# Patient Record
Sex: Female | Born: 1935 | Race: Black or African American | Hispanic: No | Marital: Single | Smoking: Never smoker
Health system: Southern US, Community
[De-identification: ages and names within clinical notes are randomized; demographics above are authoritative.]

## PROBLEM LIST (undated history)

## (undated) DIAGNOSIS — H409 Unspecified glaucoma: Secondary | ICD-10-CM

## (undated) DIAGNOSIS — E079 Disorder of thyroid, unspecified: Secondary | ICD-10-CM

## (undated) DIAGNOSIS — I10 Essential (primary) hypertension: Secondary | ICD-10-CM

## (undated) DIAGNOSIS — I2510 Atherosclerotic heart disease of native coronary artery without angina pectoris: Secondary | ICD-10-CM

## (undated) DIAGNOSIS — M069 Rheumatoid arthritis, unspecified: Secondary | ICD-10-CM

## (undated) HISTORY — PX: ABDOMINAL HYSTERECTOMY: SHX81

## (undated) HISTORY — PX: EYE SURGERY: SHX253

## (undated) HISTORY — DX: Rheumatoid arthritis, unspecified: M06.9

## (undated) HISTORY — DX: Atherosclerotic heart disease of native coronary artery without angina pectoris: I25.10

## (undated) HISTORY — PX: OTHER SURGICAL HISTORY: SHX169

## (undated) HISTORY — DX: Essential (primary) hypertension: I10

## (undated) HISTORY — DX: Unspecified glaucoma: H40.9

## (undated) HISTORY — DX: Disorder of thyroid, unspecified: E07.9

---

## 2003-08-06 HISTORY — PX: CORONARY ARTERY BYPASS GRAFT: SHX141

## 2006-08-05 HISTORY — PX: OTHER SURGICAL HISTORY: SHX169

## 2014-03-07 DIAGNOSIS — F0390 Unspecified dementia without behavioral disturbance: Secondary | ICD-10-CM | POA: Insufficient documentation

## 2014-03-14 ENCOUNTER — Ambulatory Visit: Payer: Self-pay | Admitting: Neurology

## 2014-03-28 ENCOUNTER — Other Ambulatory Visit: Payer: Self-pay | Admitting: Adult Health

## 2014-03-28 ENCOUNTER — Encounter: Payer: Self-pay | Admitting: Adult Health

## 2014-03-28 ENCOUNTER — Ambulatory Visit (INDEPENDENT_AMBULATORY_CARE_PROVIDER_SITE_OTHER): Payer: Medicare Other | Admitting: Adult Health

## 2014-03-28 ENCOUNTER — Ambulatory Visit (INDEPENDENT_AMBULATORY_CARE_PROVIDER_SITE_OTHER)
Admission: RE | Admit: 2014-03-28 | Discharge: 2014-03-28 | Disposition: A | Discharge status: Home / Self Care | Payer: Medicare Other | Source: Ambulatory Visit | Attending: Adult Health | Admitting: Adult Health

## 2014-03-28 VITALS — BP 122/62 | HR 64 | Temp 97.9°F | Resp 14 | Ht 63.5 in | Wt 96.2 lb

## 2014-03-28 DIAGNOSIS — R413 Other amnesia: Secondary | ICD-10-CM

## 2014-03-28 DIAGNOSIS — R5383 Other fatigue: Secondary | ICD-10-CM

## 2014-03-28 DIAGNOSIS — D649 Anemia, unspecified: Secondary | ICD-10-CM

## 2014-03-28 DIAGNOSIS — R7989 Other specified abnormal findings of blood chemistry: Secondary | ICD-10-CM

## 2014-03-28 DIAGNOSIS — M25559 Pain in unspecified hip: Secondary | ICD-10-CM

## 2014-03-28 DIAGNOSIS — R5381 Other malaise: Secondary | ICD-10-CM

## 2014-03-28 LAB — POCT URINALYSIS DIPSTICK
Bilirubin, UA: NEGATIVE
Blood, UA: NEGATIVE
GLUCOSE UA: 100
KETONES UA: NEGATIVE
LEUKOCYTES UA: NEGATIVE
Nitrite, UA: POSITIVE
PROTEIN UA: 30
SPEC GRAV UA: 1.015
UROBILINOGEN UA: 0.2
pH, UA: 6

## 2014-03-28 LAB — COMPREHENSIVE METABOLIC PANEL
ALT: 28 U/L (ref 0–35)
AST: 26 U/L (ref 0–37)
Albumin: 3.2 g/dL — ABNORMAL LOW (ref 3.5–5.2)
Alkaline Phosphatase: 104 U/L (ref 39–117)
BUN: 18 mg/dL (ref 6–23)
CALCIUM: 8.5 mg/dL (ref 8.4–10.5)
CO2: 16 meq/L — AB (ref 19–32)
CREATININE: 0.8 mg/dL (ref 0.4–1.2)
Chloride: 118 mEq/L — ABNORMAL HIGH (ref 96–112)
GFR: 82.21 mL/min (ref >60.00)
GLUCOSE: 98 mg/dL (ref 70–99)
Potassium: 3.7 mEq/L (ref 3.5–5.1)
Sodium: 145 mEq/L (ref 135–145)
Total Bilirubin: 0.4 mg/dL (ref 0.2–1.2)
Total Protein: 6.1 g/dL (ref 6.0–8.3)

## 2014-03-28 LAB — CBC WITH DIFFERENTIAL/PLATELET
BASOS ABS: 0 10*3/uL (ref 0.0–0.1)
BASOS PCT: 0.4 % (ref 0.0–3.0)
EOS ABS: 0 10*3/uL (ref 0.0–0.7)
Eosinophils Relative: 0.5 % (ref 0.0–5.0)
HCT: 36.3 % (ref 36.0–46.0)
HEMOGLOBIN: 11.6 g/dL — AB (ref 12.0–15.0)
Lymphocytes Relative: 14.1 % (ref 12.0–46.0)
Lymphs Abs: 1.2 10*3/uL (ref 0.7–4.0)
MCHC: 32 g/dL (ref 30.0–36.0)
MCV: 86.7 fl (ref 78.0–100.0)
MONOS PCT: 9 % (ref 3.0–12.0)
Monocytes Absolute: 0.8 10*3/uL (ref 0.1–1.0)
NEUTROS ABS: 6.4 10*3/uL (ref 1.4–7.7)
Neutrophils Relative %: 76 % (ref 43.0–77.0)
Platelets: 225 10*3/uL (ref 150.0–400.0)
RBC: 4.19 Mil/uL (ref 3.87–5.11)
RDW: 16.5 % — ABNORMAL HIGH (ref 11.5–15.5)
WBC: 8.4 10*3/uL (ref 4.0–10.5)

## 2014-03-28 LAB — TSH: TSH: 0.19 u[IU]/mL — ABNORMAL LOW (ref 0.35–4.50)

## 2014-03-28 MED ORDER — PREDNISONE 5 MG PO TABS: 7.5000 mg | ORAL_TABLET | Freq: Every day | ORAL | Status: DC

## 2014-03-28 NOTE — Progress Notes (Signed)
Pre visit review using our clinic review tool, if applicable. No additional management support is needed unless otherwise documented below in the visit note. 

## 2014-03-28 NOTE — Progress Notes (Signed)
Patient ID: Bailey Lindsey, female   DOB: [birth year], 80 y.o.   MRN: [id number]   Subjective:    Patient ID: Bailey Lindsey, female    DOB: 11/06/1923, 80 y.o.   MRN: [id number]  HPI  Pt is a pleasant 80 y/o female who presents to clinic to establish care. She recently moved from [place] and is living with one of her daughters. Pt has history of dementia. Recently saw Dr. Clelia Croft at [facility] which he recommended medication to help slow down short term memory loss. Pt's daughter reports that Bailey Lindsey fell 1 week ago. She has been complaining of bilateral hip and leg pain. Hx of right hip replacement. Has also been fatigued and significant loss of energy since her fall. Appetite is diminished although daughter reports that she has gained 2 lbs since living with her.    Past Medical History  Diagnosis Date  . RA (rheumatoid arthritis)   . Glaucoma     Followed by [facility]  . Hypertension   . Thyroid disease   . CAD (coronary artery disease)      Past Surgical History  Procedure Laterality Date  . Abdominal hysterectomy    . Coronary artery bypass graft  2005    triple bypass  . Right hip replacement    . Dislocated left shoulder  2008  . Eye surgery      Cataract surgery    History reviewed. No pertinent family history.   History   Social History  . Marital Status: Widowed    Spouse Name: N/A    Number of Children: 9  . Years of Education: 12   Occupational History  . Homemaker    Social History Main Topics  . Smoking status: Never Smoker   . Smokeless tobacco: Never Used  . Alcohol Use: No  . Drug Use: No  . Sexual Activity: Not on file   Other Topics Concern  . Not on file   Social History Narrative   Bailey Lindsey is originally from [place], [place]. She is widowed and has 9 children. 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       Hobbies: Ceramic, community work, sang and directed choirs, gardening   Caffeine: 1  cup of coffee daily     Review of Systems  Constitutional: Positive for appetite change and fatigue.  HENT: Negative.   Respiratory: Negative.   Cardiovascular: Negative for chest pain, palpitations and leg swelling.  Gastrointestinal: Negative.   Genitourinary: Negative.   Musculoskeletal:       Bilateral hip pain following fall 1 week ago.  Neurological: Positive for weakness. Negative for dizziness, light-headedness and headaches.  Psychiatric/Behavioral: Positive for confusion. Negative for hallucinations, behavioral problems, sleep disturbance and agitation. The patient is not nervous/anxious.   All other systems reviewed and are negative.      Objective:  BP 122/62  Pulse 64  Temp(Src) 97.9 F (36.6 C) (Oral)  Resp 14  Ht 5' 3.5" (1.613 m)  Wt 96 lb 4 oz (43.659 kg)  BMI 16.78 kg/m2  SpO2 98%   Physical Exam  Constitutional: She is oriented to person, place, and time. No distress.  Frail appearing female  HENT:  Head: Normocephalic and atraumatic.  Eyes: Conjunctivae and EOM are normal.  Neck: Normal range of motion. Neck supple.  Cardiovascular: Normal rate, regular rhythm, normal heart sounds and intact distal pulses.  Exam reveals no gallop and no friction rub.  No murmur heard. Pulmonary/Chest: Effort normal and breath sounds normal. No respiratory distress. She has no wheezes. She has no rales.  Musculoskeletal: Normal range of motion.  Neurological: She is alert and oriented to person, place, and time. She has normal reflexes. Coordination normal.  Skin: Skin is warm and dry.  Psychiatric: She has a normal mood and affect. Her behavior is normal.  Responds appropriately to simple questions. Not very engaging in conversation. Slow to respond.      Assessment & Plan:   1. Other fatigue Considered the possibility of depression since she has recently moved away from her "home". Pt, however, denies this. Will check labs to check for thyroid, electrolyte  abnormality or anemia.  - TSH - Comprehensive metabolic panel - CBC with Differential  2. Hip pain, unspecified laterality Having pain since her fall 1 week ago. Hx of right hip replacement. Send to have bil hip xray  - DG Hip Bilateral W/Pelvis; Future  3. Memory loss She has recently seen Dr. Clelia Croft. Considering starting aricept or namenda. Recently had lab which showed normal B12. I am checking RPR and UA. - RPR - POCT urinalysis dipstick

## 2014-03-29 LAB — RPR

## 2014-03-29 NOTE — Addendum Note (Signed)
Addended by: Montine Circle D on: 03/29/2014 08:00 AM   Modules accepted: Orders

## 2014-04-01 ENCOUNTER — Other Ambulatory Visit: Payer: Self-pay | Admitting: Adult Health

## 2014-04-01 LAB — CULTURE, URINE COMPREHENSIVE

## 2014-04-01 MED ORDER — CIPROFLOXACIN HCL 250 MG PO TABS: 250.0000 mg | ORAL_TABLET | Freq: Two times a day (BID) | ORAL | Status: DC

## 2014-04-05 ENCOUNTER — Other Ambulatory Visit: Payer: Self-pay | Admitting: Adult Health

## 2014-04-05 ENCOUNTER — Ambulatory Visit: Payer: Self-pay | Admitting: Internal Medicine

## 2014-04-05 MED ORDER — CIPROFLOXACIN 250 MG/5ML (5%) PO SUSR: 250.0000 mg | Freq: Two times a day (BID) | ORAL | Status: DC

## 2014-04-05 NOTE — Telephone Encounter (Signed)
Spoke with Michell Heinrich, pts daughter, she advised they have to crush her medications and the Cipro was bitter and the pt refused to take it.  She is requesting a liquid form of the Cipro for the pt.  Please advise

## 2014-04-05 NOTE — Telephone Encounter (Signed)
Please advise?  Pt should still have two days left from 8.28.15 Rx.

## 2014-04-05 NOTE — Telephone Encounter (Signed)
Spoke with pts daughter, advised Rx sent

## 2014-04-05 NOTE — Telephone Encounter (Signed)
Please call daughter and let her know I sent prescription. She is to take as directed on the bottle for 7 days.

## 2014-04-05 NOTE — Telephone Encounter (Signed)
Please call and clarify why they are requesting a refill on Cipro. Thanks

## 2014-04-09 ENCOUNTER — Other Ambulatory Visit: Payer: Self-pay | Admitting: Adult Health

## 2014-04-10 ENCOUNTER — Emergency Department: Payer: Self-pay | Admitting: Emergency Medicine

## 2014-04-18 ENCOUNTER — Ambulatory Visit (INDEPENDENT_AMBULATORY_CARE_PROVIDER_SITE_OTHER): Payer: Medicare Other | Admitting: Adult Health

## 2014-04-18 ENCOUNTER — Encounter: Payer: Self-pay | Admitting: Adult Health

## 2014-04-18 VITALS — BP 112/64 | HR 64 | Temp 98.1°F | Resp 12 | Ht 63.5 in | Wt 98.5 lb

## 2014-04-18 DIAGNOSIS — B029 Zoster without complications: Secondary | ICD-10-CM

## 2014-04-18 NOTE — Patient Instructions (Signed)
  Shingles is healing nicely.  You may use calamine lotion but do not use close to the eye.  This is sold over the counter.

## 2014-04-18 NOTE — Progress Notes (Signed)
Pre visit review using our clinic review tool, if applicable. No additional management support is needed unless otherwise documented below in the visit note. 

## 2014-04-18 NOTE — Progress Notes (Signed)
Patient ID: Bailey Lindsey, female   DOB: 1923/11/11, 80 y.o.   MRN: [id number]   Subjective:    Patient ID: Bailey Lindsey, female    DOB: Sep 16, 1923, 80 y.o.   MRN: [id number]  HPI  Pt presents to clinic following visit to the ED for shingles. She was started on Valtrex. Patient's daughter also reports that she took her to see an ophthalmologist because the lesions are on the right side of her face on the bottom part of her eye. There was no involvement of the eye. The lesions are drying. Patient denies pain.  Past Medical History  Diagnosis Date  . RA (rheumatoid arthritis)   . Glaucoma     Followed by [facility]  . Hypertension   . Thyroid disease   . CAD (coronary artery disease)     Current Outpatient Prescriptions on File Prior to Visit  Medication Sig Dispense Refill  . acetaminophen (TYLENOL) 500 MG tablet Take 500 mg by mouth 2 (two) times daily.      Marland Kitchen acetaZOLAMIDE (DIAMOX) 500 MG capsule Take 500 mg by mouth 2 (two) times daily.      Marland Kitchen alendronate (FOSAMAX) 70 MG tablet Take 70 mg by mouth once a week. Take with a full glass of water on an empty stomach.      Marland Kitchen amLODipine (NORVASC) 10 MG tablet Take 10 mg by mouth daily.      Marland Kitchen aspirin 81 MG tablet Take 81 mg by mouth daily.      Marland Kitchen atropine 1 % ophthalmic solution Place 1 drop into the right eye 2 (two) times daily.      . brimonidine-timolol (COMBIGAN) 0.2-0.5 % ophthalmic solution Place 1 drop into the right eye every 12 (twelve) hours.      . Cholecalciferol (VITAMIN D-3) 1000 UNITS CAPS Take 2 capsules by mouth 2 (two) times daily.      . enalapril (VASOTEC) 20 MG tablet Take 20 mg by mouth 2 (two) times daily.      Marland Kitchen levothyroxine (SYNTHROID, LEVOTHROID) 50 MCG tablet Take 50 mcg by mouth daily before breakfast.      . metoprolol (LOPRESSOR) 50 MG tablet Take 50 mg by mouth 2 (two) times daily.      . pravastatin (PRAVACHOL) 40 MG tablet Take 40 mg by mouth daily.      . predniSONE (DELTASONE) 5 MG  tablet Take 1.5 tablets (7.5 mg total) by mouth daily with breakfast.  45 tablet  5  . travoprost, benzalkonium, (TRAVATAN) 0.004 % ophthalmic solution Place 1 drop into both eyes at bedtime.       No current facility-administered medications on file prior to visit.     Review of Systems  Unable to perform ROS Skin:       Follow up for shingles on right side of face  Psychiatric/Behavioral:       Dementia       Objective:  Ht 5' 3.5" (1.613 m)  Wt 98 lb 8 oz (44.679 kg)  BMI 17.17 kg/m2   Physical Exam  Constitutional: No distress.  Eyes: Conjunctivae and EOM are normal.  Cardiovascular: Normal rate, regular rhythm and normal heart sounds.  Exam reveals no gallop and no friction rub.   No murmur heard. Pulmonary/Chest: Effort normal and breath sounds normal. No respiratory distress. She has no wheezes. She has no rales.  Neurological: She is alert.  Skin:  Shingles on right side of face with vesicular lesions almost completely dry  Assessment & Plan:   1. Shingles Right side of her face. On valtrex. Lesions are drying. Denies pain. May apply calamine lotion but instructed to not apply around the eye area. RTC prn

## 2014-04-22 ENCOUNTER — Inpatient Hospital Stay: Payer: Self-pay | Admitting: Internal Medicine

## 2014-04-22 LAB — COMPREHENSIVE METABOLIC PANEL
Albumin: 2.1 g/dL — ABNORMAL LOW (ref 3.4–5.0)
Alkaline Phosphatase: 113 U/L
Anion Gap: 13 (ref 7–16)
BILIRUBIN TOTAL: 0.7 mg/dL (ref 0.2–1.0)
BUN: 22 mg/dL — ABNORMAL HIGH (ref 7–18)
CHLORIDE: 119 mmol/L — AB (ref 98–107)
Calcium, Total: 7.9 mg/dL — ABNORMAL LOW (ref 8.5–10.1)
Co2: 12 mmol/L — ABNORMAL LOW (ref 21–32)
Creatinine: 0.98 mg/dL (ref 0.60–1.30)
GFR CALC AF AMER: 60 — AB
GFR CALC NON AF AMER: 52 — AB
Glucose: 118 mg/dL — ABNORMAL HIGH (ref 65–99)
OSMOLALITY: 291 (ref 275–301)
POTASSIUM: 3.4 mmol/L — AB (ref 3.5–5.1)
SGOT(AST): 20 U/L (ref 15–37)
SGPT (ALT): 12 U/L — ABNORMAL LOW
Sodium: 144 mmol/L (ref 136–145)
Total Protein: 6.1 g/dL — ABNORMAL LOW (ref 6.4–8.2)

## 2014-04-22 LAB — URINALYSIS, COMPLETE
BACTERIA: NONE SEEN
Bilirubin,UR: NEGATIVE
Blood: NEGATIVE
Glucose,UR: 50 mg/dL (ref 0–75)
LEUKOCYTE ESTERASE: NEGATIVE
Nitrite: NEGATIVE
PH: 5 (ref 4.5–8.0)
Protein: 100
RBC,UR: 1 /HPF (ref 0–5)
Specific Gravity: 1.017 (ref 1.003–1.030)
Squamous Epithelial: NONE SEEN
WBC UR: 2 /HPF (ref 0–5)

## 2014-04-22 LAB — CBC
HCT: 37.2 % (ref 35.0–47.0)
HGB: 11.8 g/dL — ABNORMAL LOW (ref 12.0–16.0)
MCH: 27.4 pg (ref 26.0–34.0)
MCHC: 31.6 g/dL — ABNORMAL LOW (ref 32.0–36.0)
MCV: 87 fL (ref 80–100)
Platelet: 211 10*3/uL (ref 150–440)
RBC: 4.3 10*6/uL (ref 3.80–5.20)
RDW: 15.9 % — AB (ref 11.5–14.5)
WBC: 30 10*3/uL — ABNORMAL HIGH (ref 3.6–11.0)

## 2014-04-22 LAB — DIFFERENTIAL
BASOS ABS: 0.1 10*3/uL (ref 0.0–0.1)
Basophil %: 0.2 %
EOS PCT: 0 %
Eosinophil #: 0 10*3/uL (ref 0.0–0.7)
LYMPHS ABS: 0.6 10*3/uL — AB (ref 1.0–3.6)
Lymphocyte %: 1.9 %
Monocyte #: 1.6 x10 3/mm — ABNORMAL HIGH (ref 0.2–0.9)
Monocyte %: 5.2 %
NEUTROS ABS: 27.8 10*3/uL — AB (ref 1.4–6.5)
Neutrophil %: 92.7 %

## 2014-04-22 LAB — CK TOTAL AND CKMB (NOT AT ARMC)
CK, TOTAL: 87 U/L
CK, TOTAL: 53 U/L
CK, Total: 47 U/L
CK-MB: 1.7 ng/mL (ref 0.5–3.6)
CK-MB: 2.1 ng/mL (ref 0.5–3.6)
CK-MB: 2.2 ng/mL (ref 0.5–3.6)

## 2014-04-22 LAB — TROPONIN I
TROPONIN-I: 0.05 ng/mL
Troponin-I: 0.06 ng/mL — ABNORMAL HIGH
Troponin-I: 0.11 ng/mL — ABNORMAL HIGH

## 2014-04-23 DIAGNOSIS — I369 Nonrheumatic tricuspid valve disorder, unspecified: Secondary | ICD-10-CM

## 2014-04-23 LAB — CBC WITH DIFFERENTIAL/PLATELET
BASOS ABS: 0 10*3/uL (ref 0.0–0.1)
BASOS PCT: 0.2 %
EOS PCT: 0 %
Eosinophil #: 0 10*3/uL (ref 0.0–0.7)
HCT: 37.8 % (ref 35.0–47.0)
HGB: 11.8 g/dL — AB (ref 12.0–16.0)
Lymphocyte #: 0.7 10*3/uL — ABNORMAL LOW (ref 1.0–3.6)
Lymphocyte %: 2.3 %
MCH: 27 pg (ref 26.0–34.0)
MCHC: 31.2 g/dL — ABNORMAL LOW (ref 32.0–36.0)
MCV: 87 fL (ref 80–100)
MONOS PCT: 4.3 %
Monocyte #: 1.2 x10 3/mm — ABNORMAL HIGH (ref 0.2–0.9)
Neutrophil #: 26.8 10*3/uL — ABNORMAL HIGH (ref 1.4–6.5)
Neutrophil %: 93.2 %
Platelet: 220 10*3/uL (ref 150–440)
RBC: 4.37 10*6/uL (ref 3.80–5.20)
RDW: 15.9 % — AB (ref 11.5–14.5)
WBC: 28.8 10*3/uL — ABNORMAL HIGH (ref 3.6–11.0)

## 2014-04-23 LAB — BASIC METABOLIC PANEL
ANION GAP: 13 (ref 7–16)
BUN: 19 mg/dL — AB (ref 7–18)
CALCIUM: 7.8 mg/dL — AB (ref 8.5–10.1)
CHLORIDE: 124 mmol/L — AB (ref 98–107)
Co2: 13 mmol/L — ABNORMAL LOW (ref 21–32)
Creatinine: 0.85 mg/dL (ref 0.60–1.30)
EGFR (African American): >60
EGFR (Non-African Amer.): >60
Glucose: 86 mg/dL (ref 65–99)
Osmolality: 300 (ref 275–301)
Potassium: 3 mmol/L — ABNORMAL LOW (ref 3.5–5.1)
Sodium: 150 mmol/L — ABNORMAL HIGH (ref 136–145)

## 2014-04-23 LAB — TSH: Thyroid Stimulating Horm: 1.07 u[IU]/mL

## 2014-04-24 LAB — BASIC METABOLIC PANEL
Anion Gap: 10 (ref 7–16)
BUN: 12 mg/dL (ref 7–18)
CHLORIDE: 116 mmol/L — AB (ref 98–107)
Calcium, Total: 8.2 mg/dL — ABNORMAL LOW (ref 8.5–10.1)
Co2: 16 mmol/L — ABNORMAL LOW (ref 21–32)
Creatinine: 1.03 mg/dL (ref 0.60–1.30)
EGFR (African American): 56 — ABNORMAL LOW
GFR CALC NON AF AMER: 48 — AB
GLUCOSE: 191 mg/dL — AB (ref 65–99)
OSMOLALITY: 288 (ref 275–301)
Potassium: 3 mmol/L — ABNORMAL LOW (ref 3.5–5.1)
Sodium: 142 mmol/L (ref 136–145)

## 2014-04-24 LAB — CBC WITH DIFFERENTIAL/PLATELET
Basophil #: 0 10*3/uL (ref 0.0–0.1)
Basophil %: 0.1 %
EOS ABS: 0.1 10*3/uL (ref 0.0–0.7)
Eosinophil %: 0.3 %
HCT: 35.5 % (ref 35.0–47.0)
HGB: 11.2 g/dL — AB (ref 12.0–16.0)
Lymphocyte #: 0.9 10*3/uL — ABNORMAL LOW (ref 1.0–3.6)
Lymphocyte %: 4 %
MCH: 27 pg (ref 26.0–34.0)
MCHC: 31.6 g/dL — ABNORMAL LOW (ref 32.0–36.0)
MCV: 85 fL (ref 80–100)
Monocyte #: 0.9 x10 3/mm (ref 0.2–0.9)
Monocyte %: 3.7 %
Neutrophil #: 21.4 10*3/uL — ABNORMAL HIGH (ref 1.4–6.5)
Neutrophil %: 91.9 %
PLATELETS: 222 10*3/uL (ref 150–440)
RBC: 4.16 10*6/uL (ref 3.80–5.20)
RDW: 15.9 % — ABNORMAL HIGH (ref 11.5–14.5)
WBC: 23.3 10*3/uL — ABNORMAL HIGH (ref 3.6–11.0)

## 2014-04-25 LAB — CBC WITH DIFFERENTIAL/PLATELET
BASOS ABS: 0 10*3/uL (ref 0.0–0.1)
BASOS PCT: 0.3 %
EOS ABS: 0.1 10*3/uL (ref 0.0–0.7)
EOS PCT: 0.9 %
HCT: 33.1 % — ABNORMAL LOW (ref 35.0–47.0)
HGB: 10.3 g/dL — ABNORMAL LOW (ref 12.0–16.0)
LYMPHS PCT: 7.9 %
Lymphocyte #: 1.1 10*3/uL (ref 1.0–3.6)
MCH: 26.1 pg (ref 26.0–34.0)
MCHC: 31.2 g/dL — AB (ref 32.0–36.0)
MCV: 84 fL (ref 80–100)
MONOS PCT: 6.7 %
Monocyte #: 0.9 x10 3/mm (ref 0.2–0.9)
NEUTROS ABS: 11.7 10*3/uL — AB (ref 1.4–6.5)
NEUTROS PCT: 84.2 %
PLATELETS: 227 10*3/uL (ref 150–440)
RBC: 3.95 10*6/uL (ref 3.80–5.20)
RDW: 15.7 % — ABNORMAL HIGH (ref 11.5–14.5)
WBC: 13.9 10*3/uL — ABNORMAL HIGH (ref 3.6–11.0)

## 2014-04-25 LAB — BASIC METABOLIC PANEL
Anion Gap: 7 (ref 7–16)
BUN: 9 mg/dL (ref 7–18)
CO2: 17 mmol/L — AB (ref 21–32)
Calcium, Total: 8.1 mg/dL — ABNORMAL LOW (ref 8.5–10.1)
Chloride: 115 mmol/L — ABNORMAL HIGH (ref 98–107)
Creatinine: 0.79 mg/dL (ref 0.60–1.30)
EGFR (Non-African Amer.): >60
Glucose: 136 mg/dL — ABNORMAL HIGH (ref 65–99)
Osmolality: 278 (ref 275–301)
Potassium: 4.1 mmol/L (ref 3.5–5.1)
Sodium: 139 mmol/L (ref 136–145)

## 2014-04-25 LAB — URINALYSIS, COMPLETE
Bacteria: NONE SEEN
Bilirubin,UR: NEGATIVE
Blood: NEGATIVE
GLUCOSE, UR: NEGATIVE mg/dL (ref 0–75)
KETONE: NEGATIVE
Leukocyte Esterase: NEGATIVE
NITRITE: NEGATIVE
PH: 5 (ref 4.5–8.0)
Protein: NEGATIVE
SPECIFIC GRAVITY: 1.005 (ref 1.003–1.030)
Squamous Epithelial: NONE SEEN

## 2014-04-27 ENCOUNTER — Other Ambulatory Visit: Payer: Medicare Other

## 2014-04-27 ENCOUNTER — Telehealth: Payer: Self-pay | Admitting: Adult Health

## 2014-04-27 LAB — CULTURE, BLOOD (SINGLE)

## 2014-04-27 NOTE — Telephone Encounter (Signed)
Noted  

## 2014-04-27 NOTE — Telephone Encounter (Signed)
Bailey Lindsey from Hospice called again states after speaking with pts daughter, they have decided since pt has not been seen by Dr Lorin Picket before they are going to request that the Medical Director of Hospice assume pts care.  No further action from [facility] required.

## 2014-04-27 NOTE — Telephone Encounter (Signed)
Records placed in green folder from Hospice (received more records today)

## 2014-04-27 NOTE — Telephone Encounter (Signed)
Pt needs HFU- high white blood count, infection. Please advise where to add pt. msn

## 2014-05-05 ENCOUNTER — Ambulatory Visit: Payer: Self-pay | Admitting: Internal Medicine

## 2014-07-01 ENCOUNTER — Ambulatory Visit: Payer: Medicare Other | Admitting: Internal Medicine

## 2014-10-06 ENCOUNTER — Ambulatory Visit: Payer: Self-pay | Admitting: Family

## 2014-10-13 ENCOUNTER — Ambulatory Visit: Payer: Self-pay | Admitting: Family

## 2014-11-26 NOTE — Consult Note (Signed)
   Comments   Meetingheld with Daughter, [name], who is pt's HCPOA.  Daughter states that prior to threee weeks ago pt was eating 100% of meals and walking around house with walker.  Pt was not able to bathe, dress, or feed herself at that time.  Daughter states it itakes pt a while to eat due to excessive chewing. Daughter has noticed a decline since pt has Zoster infection.  Daughter states they have started PACE program and wants to keep with this program.  Daughter aware pt qualifies for hospice services and declines at this moment.  Daughter states to keep pt a Full Code.  Daughter states that changing pt to a DNR is the right thing to do, but cannot change her at this moment.  Daughter asked me let her pray and to revisit Code status discussion daily.   Comments   1. Order speech therapy consult.  Electronic Signatures: Darsha Zumstein, Jaquelyn Bitter (NP)  (Signed 21-Sep-15 11:22)  Authored: Palliative Care, Vital Signs Phifer, Harriett Sine (MD)  (Signed 21-Sep-15 21:22)  Authored: Palliative Care   Last Updated: 21-Sep-15 21:22 by Phifer, Harriett Sine (MD)

## 2014-11-26 NOTE — Discharge Summary (Signed)
PATIENT NAME:  Bailey Lindsey, Bailey Lindsey MR#:  [id number] DATE OF BIRTH:  1924/01/26  DATE OF ADMISSION:  04/22/2014 DATE OF DISCHARGE:  04/27/2014  DISCHARGE DIAGNOSES: 1.  Generalized weakness.  2.  Failure to thrive.  3.  Hyponatremia secondary to poor p.o. intake.  4.  Alzheimer dementia.  5.  Shingles.  6.  Possible acute bronchitis.   DISCHARGE MEDICATIONS: 1.  Tramadol 50 mg every 8 hours as needed. 2.  Tylenol extra-strength 500 mg 2 times a day as needed.  3.  Amlodipine 10 mg p.o. daily.  4.  KCl 20 mEq p.o. daily.  5.  Levothyroxine 50 mcg p.o. daily.  6.  Fosamax 70 mg once a day.  7.  Acetazolamide 500 mg 1 capsule p.o. b.i.d.  8.  Prednisone 5 mg p.o. 1-1/2 tablets daily.  9.  Pravastatin 40 mg p.o. daily.  10.  Valtrex 1 gram p.o. every 8 hours. 11.  Namenda XR 21 mg once a day. 12.  Travatan eye drops. 13.  Combigan eye drops.   15.  Metoprolol tartrate 50 mg p.o. b.i.d. 16.  Megace 10 mL 2 times daily. 17.  Levaquin 250 mg every 24 hours.  DISCHARGE DIET: The patient is on mechanical soft diet.  ANTIBIOTICS: Given Levaquin until October 3rd.  CONSULTATIONS: Palliative care with Dr. Dr. Harvie Junior.   HOSPITAL COURSE: An 81 year old female with history of dementia, rheumatoid arthritis, coronary artery disease and recurrent UTI admitted for dehydration secondary to poor p.o. intake and leukocytosis. Please look at the history and physical for full details. The patient has shingles and treated with Valtrex. The patient got nauseous and vomited and admitted to hospitalist service for chest pain, nausea and vomiting. The patient lives at home with her daughter and son-in-law. The patient's blood cultures have been negative. Urine cultures have been negative. The patient continued on Valtrex for her shingles on her nose area. The patient's white count was 13.9. Her kidney function was normal. Troponins were slightly up at 0.06. The patient's white count was 30 initially then dropped  slowly to 23.3 and then 13.9. The patient had echocardiogram because of elevated troponins, which showed an EF of 50% to 55%. Further troponin dropped to 0.11. The patient thought to have elevated troponins likely secondary to dehydration and leukocytosis. The patient's blood cultures and urine cultures have been negative. Seen by palliative care team. She was thought to have bronchitis so we gave her Levaquin and also Valtrex was continued. The patient's elevated troponins thought to be secondary to demand ischemia rather than acute coronary syndrome. The patient's weakness and poor p.o. intake are due to dementia rather than any other problems. The patient was seen by palliative care team and discharged home with hospice. The patient's oral intake continued to be minimal and also has dementia, never able to communicate much. The patient is discharged home with hospice. We started Megace because of her p.o. intake.   CODE STATUS: DNR.   TIME SPENT: More than 30 minutes.   ____________________________ Katha Hamming, MD sk:sb D: 05/02/2014 12:50:20 ET T: 05/02/2014 13:56:55 ET JOB#: [id number]  cc: Katha Hamming, MD, <Dictator> Katha Hamming MD ELECTRONICALLY SIGNED 05/02/2014 15:34

## 2014-11-26 NOTE — Consult Note (Signed)
   Comments   Held GOC meeting with daughter after Chaplain consult.  Daughter wishes for pt to be changed to a DNR and also states she would not want pt to have a feeding tube as she had questions about this subject after Speech Therapy consult.  Daughter states she is at peace with her decision.  Daughter would also like hospice services to be involved. 6C30% Alzheimer's DementiaProtein Calorie Malnutrition  Electronic Signatures: Reather Laurence (NP)  (Signed 21-Sep-15 15:07)  Authored: Palliative Care Phifer, Harriett Sine (MD)  (Signed 21-Sep-15 21:23)  Authored: Palliative Care   Last Updated: 21-Sep-15 21:23 by Phifer, Harriett Sine (MD)

## 2014-11-26 NOTE — H&P (Signed)
PATIENT NAME:  Bailey Lindsey, Bailey Lindsey MR#:  [id number] DATE OF BIRTH:  Dec 18, 1923  DATE OF ADMISSION:  04/22/2014  REFERRING EMERGENCY DEPARTMENT PHYSICIAN: Ferman Hamming, DO   CHIEF COMPLAINT: Weakness, nausea and vomiting.   HISTORY OF PRESENT ILLNESS: This very pleasant 81 year old female is today by her daughter after 2 days of increasing weakness.  Daughter reports that 2 weeks ago, the patient presented to the Emergency Room and was treated for a shingles infection on her right face in V2 and V3 distribution not involving the eye.  She has been compliant with Valtrex since that discharge.  The rash has been getting significantly better.  Two days ago she became abruptly ill, complaining of chest pain, nausea, and has vomited at least 1 time.  She has not been eating.  She has had new onset incontinence at night.  She has baseline dementia, which has been unchanged over this time.  There has been no focal numbness or weakness.  No facial droop.  No increasing pain in the face over the area of the rash.   PAST MEDICAL HISTORY:  1.  Recent shingles infection on the right V2 and V3 distribution.  2.  Rheumatoid arthritis.  3.  Coronary artery disease, status post CABG.  4.  Glaucoma.  5.  Hypertension.   PAST SURGICAL HISTORY:  1.  Status post right hip replacement.  2.  Post coronary artery bypass grafting.   SOCIAL HISTORY: The patient lives with her daughter.  She uses a cane or a walker for ambulation, usually the walker.  She has baseline dementia.  She does not drink alcohol or smoke cigarettes.  No illicit substance abuse.   FAMILY HISTORY: Her mother had coronary artery disease.  She has 1 daughter with breast cancer.   ALLERGIES:  FISH AND PENICILLIN.   HOME MEDICATIONS:  1.  Tylenol Extra Strength 500 mg 1 tablet twice a day as needed for pain.  2.  Tramadol 50 mg 1 tablet every 8 hours as needed for pain.  3.  Potassium chloride 1 tablet once a day.  4.  Levothyroxine 50 mcg 1  tablet once a day.  5.  Amlodipine 10 mg 1 tablet once a day.  6.  Alendronate 70 mg 1 tablet once a week.  7.  Acetazolamide 1 capsule orally twice a day.  8.  Valacyclovir 1 capsule twice a day, she has not taken this medication for the past 2 days due to nausea.   REVIEW OF SYSTEMS: Unable to perform the review of systems due to the patient's dementia.   PHYSICAL EXAMINATION:   VITAL SIGNS:  Pulse 122, respirations 18, blood pressure 137/77, oxygenation 100% on room air.  GENERAL: The patient is fatigued-appearing, sits with her eyes closed, thin, very still in the bed.  HEENT: The right pupil is enlarged compared to the left and is last reactive to light. Conjunctivae are clear with no icterus, no injection.  The right lower eyelid is very inflamed and swollen, drooping.  The skin over the right face from the eye down to the jaw is caked in a crusty substance which is supposedly calamine lotion, there is no liquid exuding from these areas. Mucous membranes are dry.  Oropharynx is clear, no oral lesions, many teeth are missing, posterior oropharynx with no exudate or tonsillar enlargement. There is no cervical lymphadenopathy. Trachea is midline.  NECK:  Supple. N Thyroid is not palpated, nontender, no nodule.  PULMONARY: Lungs are clear to auscultation bilaterally with  good air movement.  CARDIOVASCULAR: Tachycardic, regular. No murmurs, rubs, or gallops, no peripheral edema.  Peripheral pulses are 1+.  ABDOMEN: Soft, nontender, bowel sounds are normal. There is no hepatosplenomegaly. No guarding, no mass.  MUSCULOSKELETAL: She is able to move all 4 extremities.  There do not seem to be any tender or swollen joints.  She does not perform an evoked strength testing examination.  NEUROLOGIC: With the exception of a dilated right pupil and facial rash, cranial nerves seem to be intact, she is able to move all 4 extremities.  She otherwise does not participate in the neurologic examination.   PSYCHIATRIC: The patient is alert, oriented, has poor insight into her condition.  She does not show any signs of anxiety.  She does seem possibly depressed.   LABORATORY DATA:  Sodium 144, potassium 3.4, chloride 119, bicarbonate 12, BUN 22, creatinine 0.98, serum glucose 118, total protein 6.1, albumin 2.1.  Troponin elevated at 0.06. White blood cell count elevated at 30 hemoglobin 11.8, platelets 218, MCV is 87. Urinalysis no sign of infection PH 7.4, PCO2 of 22, PO2 of 79.   IMAGING: Chest x-ray shows no acute abnormalities seen.    ASSESSMENT:  1.  Possible sepsis with elevated white blood cell count, tachycardia, and acidosis.  Currently urinalysis is negative for signs of infection.  Chest x-ray negative for signs of infection.  Blood cultures are pending.  She does have a shingles rash over the right face which seems to be resolving.  She does not have any other focal symptoms to suggest a source. No other skin rashes.  No vomiting or diarrhea or abdominal pain.  She has received Rocephin in the Emergency Room. We will also continue to treat shingles with valtrex, she has missed at least 2 days of this medication.  2.  Elevated troponins in a patient with history of coronary artery disease:  We will continue to cycle cardiac enzymes.  There are no specific EKG changes to suggest ACS at this time. We will admit to telemetry. Start ASA. Consider statin and BB.  3.  Hypokalemia: Replace and replete.  4.  Rheumatoid arthritis:  She does not seem to be on any chronic medications for this at home.  We will go ahead and check an ESR to evaluate for possible rheumatoid arthritis flare.  5.  Hypertension:  The patient is not hypertensive at this time and given that she has decreased p.o. intake and illness, we will not continue her amlodipine and acetazolamide at this time.   6.  Glaucoma: Stable. We will be holding acetazolamide at this time.   TIME SPENT ON ADMISSION: 40 minutes.       ____________________________ Earleen Newport. Volanda Napoleon, MD cpw:DT D: 04/22/2014 16:20:20 ET T: 04/22/2014 16:49:39 ET JOB#: [id number]  cc: Barnetta Chapel P. Volanda Napoleon, MD, <Dictator> Aldean Jewett MD ELECTRONICALLY SIGNED 04/22/2014 23:13

## 2015-06-08 ENCOUNTER — Emergency Department
Admission: EM | Admit: 2015-06-08 | Discharge: 2015-06-08 | Disposition: A | Discharge status: Home / Self Care | Payer: Medicare (Managed Care) | Attending: Emergency Medicine | Admitting: Emergency Medicine

## 2015-06-08 ENCOUNTER — Emergency Department: Payer: Medicare (Managed Care)

## 2015-06-08 DIAGNOSIS — I10 Essential (primary) hypertension: Secondary | ICD-10-CM | POA: Diagnosis not present

## 2015-06-08 DIAGNOSIS — Z7952 Long term (current) use of systemic steroids: Secondary | ICD-10-CM | POA: Insufficient documentation

## 2015-06-08 DIAGNOSIS — Z7982 Long term (current) use of aspirin: Secondary | ICD-10-CM | POA: Insufficient documentation

## 2015-06-08 DIAGNOSIS — Z880 Allergy status to penicillin: Secondary | ICD-10-CM | POA: Diagnosis not present

## 2015-06-08 DIAGNOSIS — Z91040 Latex allergy status: Secondary | ICD-10-CM | POA: Diagnosis not present

## 2015-06-08 DIAGNOSIS — R079 Chest pain, unspecified: Secondary | ICD-10-CM | POA: Diagnosis not present

## 2015-06-08 LAB — TROPONIN I
TROPONIN I: 0.04 ng/mL — AB (ref <0.031)
Troponin I: 0.04 ng/mL — ABNORMAL HIGH (ref <0.031)

## 2015-06-08 LAB — BASIC METABOLIC PANEL
ANION GAP: 7 (ref 5–15)
BUN: 31 mg/dL — ABNORMAL HIGH (ref 6–20)
CO2: 25 mmol/L (ref 22–32)
Calcium: 9 mg/dL (ref 8.9–10.3)
Chloride: 112 mmol/L — ABNORMAL HIGH (ref 101–111)
Creatinine, Ser: 1.12 mg/dL — ABNORMAL HIGH (ref 0.44–1.00)
GFR calc Af Amer: 49 mL/min — ABNORMAL LOW (ref >60)
GFR, EST NON AFRICAN AMERICAN: 42 mL/min — AB (ref >60)
GLUCOSE: 114 mg/dL — AB (ref 65–99)
Potassium: 4.1 mmol/L (ref 3.5–5.1)
Sodium: 144 mmol/L (ref 135–145)

## 2015-06-08 LAB — CBC
HEMATOCRIT: 32.1 % — AB (ref 35.0–47.0)
Hemoglobin: 10.2 g/dL — ABNORMAL LOW (ref 12.0–16.0)
MCH: 27 pg (ref 26.0–34.0)
MCHC: 31.9 g/dL — ABNORMAL LOW (ref 32.0–36.0)
MCV: 84.6 fL (ref 80.0–100.0)
PLATELETS: 226 10*3/uL (ref 150–440)
RBC: 3.8 MIL/uL (ref 3.80–5.20)
RDW: 16.6 % — ABNORMAL HIGH (ref 11.5–14.5)
WBC: 10.4 10*3/uL (ref 3.6–11.0)

## 2015-06-08 NOTE — Discharge Instructions (Signed)
Nonspecific Chest Pain  °Chest pain can be caused by many different conditions. There is always a chance that your pain could be related to something serious, such as a heart attack or a blood clot in your lungs. Chest pain can also be caused by conditions that are not life-threatening. If you have chest pain, it is very important to follow up with your health care provider. °CAUSES  °Chest pain can be caused by: °· Heartburn. °· Pneumonia or bronchitis. °· Anxiety or stress. °· Inflammation around your heart (pericarditis) or lung (pleuritis or pleurisy). °· A blood clot in your lung. °· A collapsed lung (pneumothorax). It can develop suddenly on its own (spontaneous pneumothorax) or from trauma to the chest. °· Shingles infection (varicella-zoster virus). °· Heart attack. °· Damage to the bones, muscles, and cartilage that make up your chest wall. This can include: °¨ Bruised bones due to injury. °¨ Strained muscles or cartilage due to frequent or repeated coughing or overwork. °¨ Fracture to one or more ribs. °¨ Sore cartilage due to inflammation (costochondritis). °RISK FACTORS  °Risk factors for chest pain may include: °· Activities that increase your risk for trauma or injury to your chest. °· Respiratory infections or conditions that cause frequent coughing. °· Medical conditions or overeating that can cause heartburn. °· Heart disease or family history of heart disease. °· Conditions or health behaviors that increase your risk of developing a blood clot. °· Having had chicken pox (varicella zoster). °SIGNS AND SYMPTOMS °Chest pain can feel like: °· Burning or tingling on the surface of your chest or deep in your chest. °· Crushing, pressure, aching, or squeezing pain. °· Dull or sharp pain that is worse when you move, cough, or take a deep breath. °· Pain that is also felt in your back, neck, shoulder, or arm, or pain that spreads to any of these areas. °Your chest pain may come and go, or it may stay  constant. °DIAGNOSIS °Lab tests or other studies may be needed to find the cause of your pain. Your health care provider may have you take a test called an ambulatory ECG (electrocardiogram). An ECG records your heartbeat patterns at the time the test is performed. You may also have other tests, such as: °· Transthoracic echocardiogram (TTE). During echocardiography, sound waves are used to create a picture of all of the heart structures and to look at how blood flows through your heart. °· Transesophageal echocardiogram (TEE). This is a more advanced imaging test that obtains images from inside your body. It allows your health care provider to see your heart in finer detail. °· Cardiac monitoring. This allows your health care provider to monitor your heart rate and rhythm in real time. °· Holter monitor. This is a portable device that records your heartbeat and can help to diagnose abnormal heartbeats. It allows your health care provider to track your heart activity for several days, if needed. °· Stress tests. These can be done through exercise or by taking medicine that makes your heart beat more quickly. °· Blood tests. °· Imaging tests. °TREATMENT  °Your treatment depends on what is causing your chest pain. Treatment may include: °· Medicines. These may include: °¨ Acid blockers for heartburn. °¨ Anti-inflammatory medicine. °¨ Pain medicine for inflammatory conditions. °¨ Antibiotic medicine, if an infection is present. °¨ Medicines to dissolve blood clots. °¨ Medicines to treat coronary artery disease. °· Supportive care for conditions that do not require medicines. This may include: °¨ Resting. °¨ Applying heat   or cold packs to injured areas. °¨ Limiting activities until pain decreases. °HOME CARE INSTRUCTIONS °· If you were prescribed an antibiotic medicine, finish it all even if you start to feel better. °· Avoid any activities that bring on chest pain. °· Do not use any tobacco products, including  cigarettes, chewing tobacco, or electronic cigarettes. If you need help quitting, ask your health care provider. °· Do not drink alcohol. °· Take medicines only as directed by your health care provider. °· Keep all follow-up visits as directed by your health care provider. This is important. This includes any further testing if your chest pain does not go away. °· If heartburn is the cause for your chest pain, you may be told to keep your head raised (elevated) while sleeping. This reduces the chance that acid will go from your stomach into your esophagus. °· Make lifestyle changes as directed by your health care provider. These may include: °¨ Getting regular exercise. Ask your health care provider to suggest some activities that are safe for you. °¨ Eating a heart-healthy diet. A registered dietitian can help you to learn healthy eating options. °¨ Maintaining a healthy weight. °¨ Managing diabetes, if necessary. °¨ Reducing stress. °SEEK MEDICAL CARE IF: °· Your chest pain does not go away after treatment. °· You have a rash with blisters on your chest. °· You have a fever. °SEEK IMMEDIATE MEDICAL CARE IF:  °· Your chest pain is worse. °· You have an increasing cough, or you cough up blood. °· You have severe abdominal pain. °· You have severe weakness. °· You faint. °· You have chills. °· You have sudden, unexplained chest discomfort. °· You have sudden, unexplained discomfort in your arms, back, neck, or jaw. °· You have shortness of breath at any time. °· You suddenly start to sweat, or your skin gets clammy. °· You feel nauseous or you vomit. °· You suddenly feel light-headed or dizzy. °· Your heart begins to beat quickly, or it feels like it is skipping beats. °These symptoms may represent a serious problem that is an emergency. Do not wait to see if the symptoms will go away. Get medical help right away. Call your local emergency services (911 in the [place] Do not drive yourself to the hospital. °  °This  information is not intended to replace advice given to you by your health care provider. Make sure you discuss any questions you have with your health care provider. °  °Document Released: 05/01/2005 Document Revised: 08/12/2014 Document Reviewed: 02/25/2014 °Elsevier Interactive Patient Education ©2016 [employer] ° °

## 2015-06-08 NOTE — ED Notes (Signed)
Verbal order received to repeat troponin due to previous elevated troponin.

## 2015-06-08 NOTE — ED Notes (Signed)
Patient in via EMS from [facility] for chest pain.  EMS attempted IV with no success. Aspirin 324 mg PO given.

## 2015-06-08 NOTE — ED Notes (Addendum)
Spoke with Selena Batten from [employer], she stated that transportation is not available at this time, and that she will attempt to get in contact with the patients family.

## 2015-06-08 NOTE — ED Provider Notes (Signed)
[facility] Emergency Department Provider Note  ____________________________________________  Time seen: On arrival, via EMS  I have reviewed the triage vital signs and the nursing notes.   HISTORY  Chief Complaint Chest Pain  History limited by chronic dementia  HPI Bailey Lindsey is a 81 y.o. female who presents with complaints of chest pain. Patient is a member of [facility] and apparently reported to one of the staff that she was having chest pain. She denies chest pain to me. It is unknown how long it lasted because she is unable to tell me. She denies shortness of breath. There is no history of smoking but apparently she does have a history of coronary artery disease. She denies shortness of breath.     Past Medical History  Diagnosis Date  . RA (rheumatoid arthritis)   . Glaucoma     Followed by [facility]  . Hypertension   . Thyroid disease   . CAD (coronary artery disease)     Patient Active Problem List   Diagnosis Date Noted  . Other fatigue 03/28/2014  . Hip pain 03/28/2014  . Memory loss 03/28/2014  . Dementia 03/07/2014    Past Surgical History  Procedure Laterality Date  . Abdominal hysterectomy    . Coronary artery bypass graft  2005    triple bypass  . Right hip replacement    . Dislocated left shoulder  2008  . Eye surgery      Cataract surgery    Current Outpatient Rx  Name  Route  Sig  Dispense  Refill  . acetaminophen (TYLENOL) 500 MG tablet   Oral   Take 500 mg by mouth 2 (two) times daily.         Marland Kitchen alendronate (FOSAMAX) 70 MG tablet   Oral   Take 70 mg by mouth once a week. Take with a full glass of water on an empty stomach.         Marland Kitchen amLODipine (NORVASC) 10 MG tablet   Oral   Take 10 mg by mouth daily.         Marland Kitchen aspirin 81 MG tablet   Oral   Take 81 mg by mouth daily.         Marland Kitchen atropine 1 % ophthalmic solution   Right Eye   Place 1 drop into the right eye 2 (two)  times daily.         . brimonidine-timolol (COMBIGAN) 0.2-0.5 % ophthalmic solution   Right Eye   Place 1 drop into the right eye every 12 (twelve) hours.         . Cholecalciferol (VITAMIN D-3) 1000 UNITS CAPS   Oral   Take 2 capsules by mouth 2 (two) times daily.         Marland Kitchen levothyroxine (SYNTHROID, LEVOTHROID) 50 MCG tablet   Oral   Take 50 mcg by mouth daily before breakfast.         . loratadine (CLARITIN) 10 MG tablet   Oral   Take 10 mg by mouth daily.         . memantine (NAMENDA XR) 28 MG CP24 24 hr capsule   Oral   Take 28 mg by mouth daily.         . metoprolol (LOPRESSOR) 50 MG tablet   Oral   Take 50 mg by mouth 2 (two) times daily.         . potassium chloride SA (K-DUR,KLOR-CON) 20  MEQ tablet   Oral   Take 10 mEq by mouth 2 (two) times daily. Dissolve half a tablet in water         . pravastatin (PRAVACHOL) 40 MG tablet   Oral   Take 40 mg by mouth daily.         . predniSONE (DELTASONE) 5 MG tablet   Oral   Take 1.5 tablets (7.5 mg total) by mouth daily with breakfast.   45 tablet   5   . sennosides-docusate sodium (SENOKOT-S) 8.6-50 MG tablet   Oral   Take 2 tablets by mouth 2 (two) times daily.         . travoprost, benzalkonium, (TRAVATAN) 0.004 % ophthalmic solution   Both Eyes   Place 1 drop into both eyes at bedtime.         . traZODone (DESYREL) 50 MG tablet   Oral   Take 12.5 mg by mouth at bedtime as needed for sleep.           Allergies Caffeine; Codeine; Fish oil; Librium; Penicillins; Shellfish allergy; Latex; and Minocycline  No family history on file.  Social History Social History  Substance Use Topics  . Smoking status: Never Smoker   . Smokeless tobacco: Never Used  . Alcohol Use: No    Review of Systems significantly limited by dementia which prevents accurate responses, patient has essentially denied the entire review of systems  Constitutional: Negative for fever. Eyes: Negative for visual  changes. ENT: Negative for sore throat Cardiovascular: Negative for chest pain. Respiratory: Negative for shortness of breath. Gastrointestinal: Negative for abdominal pain, vomiting and diarrhea. Genitourinary: Negative for dysuria. Musculoskeletal: Negative for back pain. Skin: Negative for rash. Neurological: Negative for headaches or focal weakness     ____________________________________________   PHYSICAL EXAM:  VITAL SIGNS: ED Triage Vitals  Enc Vitals Group     BP 06/08/15 1059 175/85 mmHg     Pulse Rate 06/08/15 1059 67     Resp 06/08/15 1059 16     Temp 06/08/15 1059 98.6 F (37 C)     Temp Source 06/08/15 1059 Oral     SpO2 06/08/15 1059 97 %     Weight 06/08/15 1059 110 lb 3.7 oz (50 kg)     Height 06/08/15 1059 5\' 4"  (1.626 m)     Head Cir --      Peak Flow --      Pain Score --      Pain Loc --      Pain Edu? --      Excl. in GC? --      Constitutional: Alert and oriented. Well appearing and in no distress. Eyes: Conjunctivae are normal.  ENT   Head: Normocephalic and atraumatic.   Mouth/Throat: Mucous membranes are moist. Cardiovascular: Normal rate, regular rhythm. Normal and symmetric distal pulses are present in all extremities. 2/6 systolic murmur Respiratory: Normal respiratory effort without tachypnea nor retractions. Breath sounds are clear and equal bilaterally.  Gastrointestinal: Soft and non-tender in all quadrants. No distention. There is no CVA tenderness. Genitourinary: deferred Musculoskeletal: Nontender with normal range of motion in all extremities. No lower extremity tenderness nor edema. Neurologic:  Normal speech and language. No gross focal neurologic deficits are appreciated. Skin:  Skin is warm, dry and intact. No rash noted. Psychiatric: Mood and affect are normal. Patient exhibits appropriate insight and judgment.  ____________________________________________    LABS (pertinent positives/negatives)  Labs Reviewed   CBC - Abnormal; Notable for  the following:    Hemoglobin 10.2 (*)    HCT 32.1 (*)    MCHC 31.9 (*)    RDW 16.6 (*)    All other components within normal limits  BASIC METABOLIC PANEL - Abnormal; Notable for the following:    Chloride 112 (*)    Glucose, Bld 114 (*)    BUN 31 (*)    Creatinine, Ser 1.12 (*)    GFR calc non Af Amer 42 (*)    GFR calc Af Amer 49 (*)    All other components within normal limits  TROPONIN I - Abnormal; Notable for the following:    Troponin I 0.04 (*)    All other components within normal limits  TROPONIN I    ____________________________________________   EKG  ED ECG REPORT I, Jene Every, the attending physician, personally viewed and interpreted this ECG.   Date: 06/08/2015  EKG Time: 11 AM  Rate: 68  Rhythm: normal sinus rhythm  Axis: Left axis  Intervals:none  ST&T Change: Nonspecific   ____________________________________________    RADIOLOGY I have personally reviewed any xrays that were ordered on this patient: Normal chest x-ray  ____________________________________________   PROCEDURES  Procedure(s) performed: none  Critical Care performed: none  ____________________________________________   INITIAL IMPRESSION / ASSESSMENT AND PLAN / ED COURSE  Pertinent labs & imaging results that were available during my care of the patient were reviewed by me and considered in my medical decision making (see chart for details).  Patient with mildly elevated troponin. Given that we are unable to obtain accurate history given her dementia we will repeat the troponin to see if there is a trend upwards or if this is not significant. Patient denies chest pain in the emergency department  Second troponin unchanged. Patient is not had any chest pain while in emergency. Her vitals are all unremarkable. Feel she is appropriate for outpatient workup as needed. Attempted [facility] contact [facility] without success. Patient is safe  for discharge d  ____________________________________________   FINAL CLINICAL IMPRESSION(S) / ED DIAGNOSES  Final diagnoses:  Chest pain, unspecified chest pain type     Jene Every, MD 06/08/15 1459

## 2015-06-08 NOTE — ED Notes (Signed)
Pt redressed and ready for d/c. Pt awaiting transport.

## 2015-08-08 ENCOUNTER — Inpatient Hospital Stay
Admission: EM | Admit: 2015-08-08 | Discharge: 2015-08-17 | DRG: 299 | Disposition: A | Discharge status: Home / Self Care | Payer: Medicare (Managed Care) | Attending: Internal Medicine | Admitting: Internal Medicine

## 2015-08-08 ENCOUNTER — Ambulatory Visit
Admission: RE | Admit: 2015-08-08 | Discharge: 2015-08-08 | Disposition: A | Discharge status: Home / Self Care | Payer: Medicare (Managed Care) | Source: Ambulatory Visit | Attending: Geriatric Medicine | Admitting: Geriatric Medicine

## 2015-08-08 ENCOUNTER — Encounter: Payer: Self-pay | Admitting: Emergency Medicine

## 2015-08-08 ENCOUNTER — Other Ambulatory Visit: Payer: Self-pay | Admitting: Geriatric Medicine

## 2015-08-08 DIAGNOSIS — E870 Hyperosmolality and hypernatremia: Secondary | ICD-10-CM | POA: Diagnosis present

## 2015-08-08 DIAGNOSIS — Z951 Presence of aortocoronary bypass graft: Secondary | ICD-10-CM

## 2015-08-08 DIAGNOSIS — F0390 Unspecified dementia without behavioral disturbance: Secondary | ICD-10-CM | POA: Diagnosis present

## 2015-08-08 DIAGNOSIS — H409 Unspecified glaucoma: Secondary | ICD-10-CM | POA: Diagnosis present

## 2015-08-08 DIAGNOSIS — Z7982 Long term (current) use of aspirin: Secondary | ICD-10-CM

## 2015-08-08 DIAGNOSIS — T380X5A Adverse effect of glucocorticoids and synthetic analogues, initial encounter: Secondary | ICD-10-CM | POA: Diagnosis present

## 2015-08-08 DIAGNOSIS — Z90710 Acquired absence of both cervix and uterus: Secondary | ICD-10-CM | POA: Diagnosis not present

## 2015-08-08 DIAGNOSIS — Z79899 Other long term (current) drug therapy: Secondary | ICD-10-CM

## 2015-08-08 DIAGNOSIS — I82413 Acute embolism and thrombosis of femoral vein, bilateral: Secondary | ICD-10-CM | POA: Insufficient documentation

## 2015-08-08 DIAGNOSIS — Z888 Allergy status to other drugs, medicaments and biological substances status: Secondary | ICD-10-CM

## 2015-08-08 DIAGNOSIS — N170 Acute kidney failure with tubular necrosis: Secondary | ICD-10-CM | POA: Diagnosis present

## 2015-08-08 DIAGNOSIS — E860 Dehydration: Secondary | ICD-10-CM | POA: Diagnosis present

## 2015-08-08 DIAGNOSIS — Z96641 Presence of right artificial hip joint: Secondary | ICD-10-CM | POA: Diagnosis present

## 2015-08-08 DIAGNOSIS — Z91040 Latex allergy status: Secondary | ICD-10-CM | POA: Diagnosis not present

## 2015-08-08 DIAGNOSIS — I10 Essential (primary) hypertension: Secondary | ICD-10-CM | POA: Diagnosis present

## 2015-08-08 DIAGNOSIS — E039 Hypothyroidism, unspecified: Secondary | ICD-10-CM | POA: Diagnosis present

## 2015-08-08 DIAGNOSIS — I82403 Acute embolism and thrombosis of unspecified deep veins of lower extremity, bilateral: Principal | ICD-10-CM | POA: Diagnosis present

## 2015-08-08 DIAGNOSIS — M79605 Pain in left leg: Secondary | ICD-10-CM

## 2015-08-08 DIAGNOSIS — D638 Anemia in other chronic diseases classified elsewhere: Secondary | ICD-10-CM | POA: Diagnosis present

## 2015-08-08 DIAGNOSIS — M7989 Other specified soft tissue disorders: Principal | ICD-10-CM

## 2015-08-08 DIAGNOSIS — R059 Cough, unspecified: Secondary | ICD-10-CM

## 2015-08-08 DIAGNOSIS — Z9849 Cataract extraction status, unspecified eye: Secondary | ICD-10-CM | POA: Diagnosis not present

## 2015-08-08 DIAGNOSIS — D72829 Elevated white blood cell count, unspecified: Secondary | ICD-10-CM | POA: Diagnosis present

## 2015-08-08 DIAGNOSIS — R079 Chest pain, unspecified: Secondary | ICD-10-CM | POA: Diagnosis not present

## 2015-08-08 DIAGNOSIS — Z880 Allergy status to penicillin: Secondary | ICD-10-CM | POA: Diagnosis not present

## 2015-08-08 DIAGNOSIS — Z66 Do not resuscitate: Secondary | ICD-10-CM | POA: Diagnosis present

## 2015-08-08 DIAGNOSIS — M069 Rheumatoid arthritis, unspecified: Secondary | ICD-10-CM | POA: Diagnosis present

## 2015-08-08 DIAGNOSIS — I2510 Atherosclerotic heart disease of native coronary artery without angina pectoris: Secondary | ICD-10-CM | POA: Diagnosis present

## 2015-08-08 DIAGNOSIS — Z886 Allergy status to analgesic agent status: Secondary | ICD-10-CM | POA: Diagnosis not present

## 2015-08-08 DIAGNOSIS — R05 Cough: Secondary | ICD-10-CM

## 2015-08-08 LAB — PROTIME-INR
INR: 1.17
PROTHROMBIN TIME: 15.1 s — AB (ref 11.4–15.0)

## 2015-08-08 LAB — CBC
HCT: 28.2 % — ABNORMAL LOW (ref 35.0–47.0)
HEMOGLOBIN: 8.9 g/dL — AB (ref 12.0–16.0)
MCH: 27 pg (ref 26.0–34.0)
MCHC: 31.4 g/dL — ABNORMAL LOW (ref 32.0–36.0)
MCV: 85.8 fL (ref 80.0–100.0)
PLATELETS: 163 10*3/uL (ref 150–440)
RBC: 3.28 MIL/uL — AB (ref 3.80–5.20)
RDW: 18 % — ABNORMAL HIGH (ref 11.5–14.5)
WBC: 13.8 10*3/uL — AB (ref 3.6–11.0)

## 2015-08-08 LAB — CBC WITH DIFFERENTIAL/PLATELET
Basophils Absolute: 0 10*3/uL (ref 0–0.1)
Basophils Relative: 0 %
EOS ABS: 0.1 10*3/uL (ref 0–0.7)
EOS PCT: 1 %
HCT: 24.4 % — ABNORMAL LOW (ref 35.0–47.0)
Hemoglobin: 7.7 g/dL — ABNORMAL LOW (ref 12.0–16.0)
LYMPHS ABS: 1.3 10*3/uL (ref 1.0–3.6)
LYMPHS PCT: 9 %
MCH: 27.4 pg (ref 26.0–34.0)
MCHC: 31.7 g/dL — AB (ref 32.0–36.0)
MCV: 86.5 fL (ref 80.0–100.0)
MONO ABS: 1 10*3/uL — AB (ref 0.2–0.9)
MONOS PCT: 8 %
Neutro Abs: 11.6 10*3/uL — ABNORMAL HIGH (ref 1.4–6.5)
Neutrophils Relative %: 82 %
PLATELETS: 134 10*3/uL — AB (ref 150–440)
RBC: 2.83 MIL/uL — AB (ref 3.80–5.20)
RDW: 17.9 % — ABNORMAL HIGH (ref 11.5–14.5)
WBC: 14 10*3/uL — ABNORMAL HIGH (ref 3.6–11.0)

## 2015-08-08 LAB — APTT
APTT: 157 s — AB (ref 24–36)
aPTT: 37 seconds — ABNORMAL HIGH (ref 24–36)

## 2015-08-08 LAB — BASIC METABOLIC PANEL
ANION GAP: 13 (ref 5–15)
BUN: 46 mg/dL — ABNORMAL HIGH (ref 6–20)
CALCIUM: 8.8 mg/dL — AB (ref 8.9–10.3)
CO2: 26 mmol/L (ref 22–32)
CREATININE: 1.9 mg/dL — AB (ref 0.44–1.00)
Chloride: 109 mmol/L (ref 101–111)
GFR calc Af Amer: 26 mL/min — ABNORMAL LOW (ref >60)
GFR, EST NON AFRICAN AMERICAN: 22 mL/min — AB (ref >60)
Glucose, Bld: 153 mg/dL — ABNORMAL HIGH (ref 65–99)
POTASSIUM: 4.1 mmol/L (ref 3.5–5.1)
Sodium: 148 mmol/L — ABNORMAL HIGH (ref 135–145)

## 2015-08-08 MED ORDER — ZOLPIDEM TARTRATE 5 MG PO TABS: 5.0000 mg | ORAL_TABLET | Freq: Every evening | ORAL | Status: DC | PRN

## 2015-08-08 MED ORDER — SODIUM CHLORIDE 0.9 % IV SOLN: 250.0000 mL | INTRAVENOUS | Status: DC | PRN

## 2015-08-08 MED ORDER — MEMANTINE HCL ER 14 MG PO CP24
28.0000 mg | ORAL_CAPSULE | Freq: Every day | ORAL | Status: DC
  Administered 2015-08-09 – 2015-08-17 (×9): 28 mg via ORAL
  Filled 2015-08-08 (×9): qty 2

## 2015-08-08 MED ORDER — ENALAPRIL MALEATE 5 MG PO TABS
5.0000 mg | ORAL_TABLET | Freq: Every day | ORAL | Status: DC
  Administered 2015-08-09 – 2015-08-12 (×4): 5 mg via ORAL
  Filled 2015-08-08 (×4): qty 1

## 2015-08-08 MED ORDER — AMLODIPINE BESYLATE 10 MG PO TABS
10.0000 mg | ORAL_TABLET | Freq: Every day | ORAL | Status: DC
  Administered 2015-08-09 – 2015-08-17 (×8): 10 mg via ORAL
  Filled 2015-08-08 (×8): qty 1

## 2015-08-08 MED ORDER — ONDANSETRON HCL 4 MG/2ML IJ SOLN: 4.0000 mg | Freq: Four times a day (QID) | INTRAMUSCULAR | Status: DC | PRN

## 2015-08-08 MED ORDER — PREDNISONE 5 MG PO TABS
5.0000 mg | ORAL_TABLET | Freq: Every day | ORAL | Status: DC
  Administered 2015-08-09 – 2015-08-17 (×8): 5 mg via ORAL
  Filled 2015-08-08 (×8): qty 1

## 2015-08-08 MED ORDER — TIMOLOL MALEATE 0.5 % OP SOLN
1.0000 [drp] | Freq: Two times a day (BID) | OPHTHALMIC | Status: DC
  Administered 2015-08-09 – 2015-08-17 (×17): 1 [drp] via OPHTHALMIC
  Filled 2015-08-08: qty 5

## 2015-08-08 MED ORDER — METOPROLOL TARTRATE 50 MG PO TABS
50.0000 mg | ORAL_TABLET | Freq: Two times a day (BID) | ORAL | Status: DC
  Administered 2015-08-09 – 2015-08-17 (×17): 50 mg via ORAL
  Filled 2015-08-08 (×17): qty 1

## 2015-08-08 MED ORDER — ACETAMINOPHEN 650 MG RE SUPP
650.0000 mg | Freq: Four times a day (QID) | RECTAL | Status: DC | PRN
Start: 2015-08-08 — End: 2015-08-17

## 2015-08-08 MED ORDER — DIPHENHYDRAMINE HCL 50 MG/ML IJ SOLN
25.0000 mg | Freq: Once | INTRAMUSCULAR | Status: AC
  Administered 2015-08-08: 25 mg via INTRAVENOUS
  Filled 2015-08-08: qty 1

## 2015-08-08 MED ORDER — ACETAMINOPHEN 325 MG PO TABS: 650.0000 mg | ORAL_TABLET | Freq: Four times a day (QID) | ORAL | Status: DC | PRN

## 2015-08-08 MED ORDER — SODIUM CHLORIDE 0.9 % IJ SOLN: 3.0000 mL | INTRAMUSCULAR | Status: DC | PRN

## 2015-08-08 MED ORDER — POLYETHYLENE GLYCOL 3350 17 G PO PACK: 17.0000 g | PACK | Freq: Every day | ORAL | Status: DC | PRN

## 2015-08-08 MED ORDER — ARGATROBAN 50 MG/50ML IV SOLN
0.5000 ug/kg/min | INTRAVENOUS | Status: DC
  Administered 2015-08-08: 2 ug/kg/min via INTRAVENOUS
  Administered 2015-08-09: 1.5 ug/kg/min via INTRAVENOUS
  Administered 2015-08-10 – 2015-08-13 (×5): 1 ug/kg/min via INTRAVENOUS
  Administered 2015-08-13: 0.9 ug/kg/min via INTRAVENOUS
  Administered 2015-08-15: 0.5 ug/kg/min via INTRAVENOUS
  Filled 2015-08-08 (×12): qty 50

## 2015-08-08 MED ORDER — APIXABAN 5 MG PO TABS: 10.0000 mg | ORAL_TABLET | Freq: Two times a day (BID) | ORAL | Status: DC

## 2015-08-08 MED ORDER — PRAVASTATIN SODIUM 40 MG PO TABS
40.0000 mg | ORAL_TABLET | Freq: Every day | ORAL | Status: DC
  Administered 2015-08-09 – 2015-08-16 (×9): 40 mg via ORAL
  Filled 2015-08-08 (×9): qty 1

## 2015-08-08 MED ORDER — BRIMONIDINE TARTRATE 0.2 % OP SOLN
1.0000 [drp] | Freq: Two times a day (BID) | OPHTHALMIC | Status: DC
  Administered 2015-08-09 – 2015-08-17 (×17): 1 [drp] via OPHTHALMIC
  Filled 2015-08-08: qty 5

## 2015-08-08 MED ORDER — HEPARIN (PORCINE) IN NACL 100-0.45 UNIT/ML-% IJ SOLN
750.0000 [IU]/h | INTRAMUSCULAR | Status: DC
  Administered 2015-08-08: 750 [IU]/h via INTRAVENOUS
  Filled 2015-08-08: qty 250

## 2015-08-08 MED ORDER — VITAMIN D 1000 UNITS PO TABS
2000.0000 [IU] | ORAL_TABLET | Freq: Every day | ORAL | Status: DC
  Administered 2015-08-09 – 2015-08-17 (×9): 2000 [IU] via ORAL
  Filled 2015-08-08 (×9): qty 2

## 2015-08-08 MED ORDER — SENNOSIDES-DOCUSATE SODIUM 8.6-50 MG PO TABS
2.0000 | ORAL_TABLET | Freq: Two times a day (BID) | ORAL | Status: DC
  Administered 2015-08-09 – 2015-08-17 (×18): 2 via ORAL
  Filled 2015-08-08 (×18): qty 2

## 2015-08-08 MED ORDER — ZINC OXIDE 20 % EX OINT
1.0000 "application " | TOPICAL_OINTMENT | Freq: Two times a day (BID) | CUTANEOUS | Status: DC | PRN
  Administered 2015-08-09: 1 via TOPICAL
  Filled 2015-08-08: qty 28.35

## 2015-08-08 MED ORDER — HEPARIN SODIUM (PORCINE) 5000 UNIT/ML IJ SOLN
60.0000 [IU]/kg | Freq: Once | INTRAMUSCULAR | Status: AC
  Administered 2015-08-08: 2850 [IU] via INTRAVENOUS

## 2015-08-08 MED ORDER — SODIUM CHLORIDE 0.9 % IJ SOLN
3.0000 mL | Freq: Two times a day (BID) | INTRAMUSCULAR | Status: DC
  Administered 2015-08-09 – 2015-08-16 (×11): 3 mL via INTRAVENOUS

## 2015-08-08 MED ORDER — POLYVINYL ALCOHOL 1.4 % OP SOLN
1.0000 [drp] | Freq: Four times a day (QID) | OPHTHALMIC | Status: DC | PRN
  Filled 2015-08-08: qty 15

## 2015-08-08 MED ORDER — ATROPINE SULFATE 1 % OP SOLN
1.0000 [drp] | Freq: Two times a day (BID) | OPHTHALMIC | Status: DC
  Administered 2015-08-09 – 2015-08-17 (×17): 1 [drp] via OPHTHALMIC
  Filled 2015-08-08: qty 2

## 2015-08-08 MED ORDER — ASPIRIN 81 MG PO CHEW
81.0000 mg | CHEWABLE_TABLET | Freq: Every day | ORAL | Status: DC
  Administered 2015-08-09 – 2015-08-10 (×2): 81 mg via ORAL
  Filled 2015-08-08 (×2): qty 1

## 2015-08-08 MED ORDER — ONDANSETRON HCL 4 MG PO TABS: 4.0000 mg | ORAL_TABLET | Freq: Four times a day (QID) | ORAL | Status: DC | PRN

## 2015-08-08 MED ORDER — LORATADINE 10 MG PO TABS
10.0000 mg | ORAL_TABLET | Freq: Every day | ORAL | Status: DC
  Administered 2015-08-09 – 2015-08-17 (×9): 10 mg via ORAL
  Filled 2015-08-08 (×8): qty 1

## 2015-08-08 MED ORDER — LATANOPROST 0.005 % OP SOLN
1.0000 [drp] | Freq: Every day | OPHTHALMIC | Status: DC
  Administered 2015-08-09 – 2015-08-16 (×9): 1 [drp] via OPHTHALMIC
  Filled 2015-08-08: qty 2.5

## 2015-08-08 MED ORDER — ACETAMINOPHEN 500 MG PO TABS
1000.0000 mg | ORAL_TABLET | Freq: Two times a day (BID) | ORAL | Status: DC
  Administered 2015-08-09 – 2015-08-17 (×18): 1000 mg via ORAL
  Filled 2015-08-08 (×18): qty 2

## 2015-08-08 MED ORDER — APIXABAN 5 MG PO TABS: 5.0000 mg | ORAL_TABLET | Freq: Two times a day (BID) | ORAL | Status: DC

## 2015-08-08 MED ORDER — LEVOTHYROXINE SODIUM 75 MCG PO TABS
75.0000 ug | ORAL_TABLET | Freq: Every day | ORAL | Status: DC
  Administered 2015-08-09 – 2015-08-17 (×8): 75 ug via ORAL
  Filled 2015-08-08 (×8): qty 1

## 2015-08-08 MED ORDER — BRIMONIDINE TARTRATE-TIMOLOL 0.2-0.5 % OP SOLN: 1.0000 [drp] | Freq: Two times a day (BID) | OPHTHALMIC | Status: DC

## 2015-08-08 NOTE — Progress Notes (Signed)
ANTICOAGULATION CONSULT NOTE - Initial Consult  Pharmacy Consult for Heparin Indication: DVT (Bilateral)  Allergies  Allergen Reactions  . Caffeine Other (See Comments)    Reaction:  Unknown   . Codeine Other (See Comments)    Reaction:  Unknown   . Fish Oil Other (See Comments)    Reaction:  Unknown   . Librium [Chlordiazepoxide] Other (See Comments)    Reaction:  Unknown   . Penicillins Other (See Comments)    Reaction:  Unknown   . Shellfish Allergy Other (See Comments)    Reaction:  Unknown   . Latex Rash  . Minocycline Itching, Swelling and Rash    Patient Measurements: Weight: 104 lb 8 oz (47.4 kg) Heparin Dosing Weight: 47.4 kg Ht 5'4"  Vital Signs: Temp: 97.6 F (36.4 C) (01/03 1732) Temp Source: Oral (01/03 1732) BP: 151/65 mmHg (01/03 1732) Pulse Rate: 74 (01/03 1732)  Labs:  Recent Labs  08/08/15 1755  HGB 8.9*  HCT 28.2*  PLT 163  APTT 37*  LABPROT 15.1*  INR 1.17  CREATININE 1.90*    Estimated Creatinine Clearance: 15 mL/min (by C-G formula based on Cr of 1.9).   Medical History: Past Medical History  Diagnosis Date  . RA (rheumatoid arthritis) (HCC)   . Glaucoma     Followed by [facility]  . Hypertension   . Thyroid disease   . CAD (coronary artery disease)      Assessment: 82 yo female here with bilateral lower extremity DVTs  Hgb 8.9, Plt 163 INR 1.17, aPTT 37  Goal of Therapy:  Heparin level 0.3-0.7 units/ml Monitor platelets by anticoagulation protocol: Yes   Plan:  ED MD order for heparin 2850 units IV x1 Will order heparin drip at 750 units/hr (=7.5 units/hr) Anti-Xa level in 8h - 1/4 at 0500 CBC in AM  Pharmacy will continue to follow.    Marty Heck 08/08/2015,7:21 PM

## 2015-08-08 NOTE — Progress Notes (Signed)
ANTICOAGULATION CONSULT NOTE - Initial Consult  Pharmacy Consult for apixaban Indication: DVT  Allergies  Allergen Reactions  . Caffeine Other (See Comments)    Reaction:  Unknown   . Codeine Other (See Comments)    Reaction:  Unknown   . Fish Oil Other (See Comments)    Reaction:  Unknown   . Librium [Chlordiazepoxide] Other (See Comments)    Reaction:  Unknown   . Penicillins Other (See Comments)    Reaction:  Unknown   . Shellfish Allergy Other (See Comments)    Reaction:  Unknown   . Latex Rash  . Minocycline Itching, Swelling and Rash    Patient Measurements: Height: 5\' 3"  (160 cm) Weight: 108 lb 9.6 oz (49.261 kg) IBW/kg (Calculated) : 52.4 Heparin Dosing Weight:   Vital Signs: Temp: 97.6 F (36.4 C) (01/03 2318) Temp Source: Oral (01/03 2318) BP: 132/66 mmHg (01/03 2318) Pulse Rate: 76 (01/03 2318)  Labs:  Recent Labs  08/08/15 1755 08/08/15 2224  HGB 8.9* 7.7*  HCT 28.2* 24.4*  PLT 163 134*  APTT 37* 157*  LABPROT 15.1*  --   INR 1.17  --   CREATININE 1.90*  --     Estimated Creatinine Clearance: 15.6 mL/min (by C-G formula based on Cr of 1.9).   Medical History: Past Medical History  Diagnosis Date  . RA (rheumatoid arthritis) (HCC)   . Glaucoma     Followed by [facility]  . Hypertension   . Thyroid disease   . CAD (coronary artery disease)     Medications:  Infusions:  . argatroban 2 mcg/kg/min (08/08/15 2247)    Assessment: 11 yof with VTE had reaction to heparin drip while here, converted to argatroban. Pharmacy consulted to transition to apixaban.  Goal of Therapy:      Plan:  Apixaban 10 mg PO BID x 7 days followed by 5 mg PO BID. No dose reduction recommended for VTE with renal dysfunction. Would consider other agents more given to close monitoring if social situation allows.   89, Pharm.D., BCPS Clinical Pharmacist 08/08/2015,11:34 PM

## 2015-08-08 NOTE — Progress Notes (Addendum)
Patient is experiencing allergic reaction after receiving heparin facial swelling/lip swelling no airway compromise at this time. Obviously difficult situation given diagnosis of extensive DVTs  Will discontinue heparin and start argatroban

## 2015-08-08 NOTE — ED Provider Notes (Signed)
[facility] Emergency Department Provider Note   ____________________________________________  Time seen: 1900  I have reviewed the triage vital signs and the nursing notes.   HISTORY  Chief Complaint Leg Pain   History limited by: dementia   HPI Bailey Lindsey is a 82 y.o. female who presents to the emergency department from ultrasound after ultrasound venous of the lower extremities showed bilateral blood clots. The patient herself is unable to give any history. It is unclear how long she has had leg swelling. She does state that she has had pain in the legs. She did deny any pain in her chest or shortness of breath.   Past Medical History  Diagnosis Date  . RA (rheumatoid arthritis) (HCC)   . Glaucoma     Followed by [facility]  . Hypertension   . Thyroid disease   . CAD (coronary artery disease)     Patient Active Problem List   Diagnosis Date Noted  . Other fatigue 03/28/2014  . Hip pain 03/28/2014  . Memory loss 03/28/2014  . Dementia 03/07/2014    Past Surgical History  Procedure Laterality Date  . Abdominal hysterectomy    . Coronary artery bypass graft  2005    triple bypass  . Right hip replacement    . Dislocated left shoulder  2008  . Eye surgery      Cataract surgery    Current Outpatient Rx  Name  Route  Sig  Dispense  Refill  . acetaminophen (TYLENOL) 500 MG tablet   Oral   Take 500 mg by mouth 2 (two) times daily.         Marland Kitchen alendronate (FOSAMAX) 70 MG tablet   Oral   Take 70 mg by mouth once a week. Take with a full glass of water on an empty stomach.         Marland Kitchen amLODipine (NORVASC) 10 MG tablet   Oral   Take 10 mg by mouth daily.         Marland Kitchen aspirin 81 MG tablet   Oral   Take 81 mg by mouth daily.         Marland Kitchen atropine 1 % ophthalmic solution   Right Eye   Place 1 drop into the right eye 2 (two) times daily.         . brimonidine-timolol (COMBIGAN) 0.2-0.5 % ophthalmic solution   Right  Eye   Place 1 drop into the right eye every 12 (twelve) hours.         . Cholecalciferol (VITAMIN D-3) 1000 UNITS CAPS   Oral   Take 2 capsules by mouth 2 (two) times daily.         Marland Kitchen levothyroxine (SYNTHROID, LEVOTHROID) 50 MCG tablet   Oral   Take 50 mcg by mouth daily before breakfast.         . loratadine (CLARITIN) 10 MG tablet   Oral   Take 10 mg by mouth daily.         . memantine (NAMENDA XR) 28 MG CP24 24 hr capsule   Oral   Take 28 mg by mouth daily.         . metoprolol (LOPRESSOR) 50 MG tablet   Oral   Take 50 mg by mouth 2 (two) times daily.         . potassium chloride SA (K-DUR,KLOR-CON) 20 MEQ tablet   Oral   Take 10 mEq by mouth 2 (two) times daily. Dissolve half  a tablet in water         . pravastatin (PRAVACHOL) 40 MG tablet   Oral   Take 40 mg by mouth daily.         . predniSONE (DELTASONE) 5 MG tablet   Oral   Take 1.5 tablets (7.5 mg total) by mouth daily with breakfast.   45 tablet   5   . sennosides-docusate sodium (SENOKOT-S) 8.6-50 MG tablet   Oral   Take 2 tablets by mouth 2 (two) times daily.         . travoprost, benzalkonium, (TRAVATAN) 0.004 % ophthalmic solution   Both Eyes   Place 1 drop into both eyes at bedtime.         . traZODone (DESYREL) 50 MG tablet   Oral   Take 12.5 mg by mouth at bedtime as needed for sleep.           Allergies Caffeine; Codeine; Fish oil; Librium; Penicillins; Shellfish allergy; Latex; and Minocycline  No family history on file.  Social History Social History  Substance Use Topics  . Smoking status: Never Smoker   . Smokeless tobacco: Never Used  . Alcohol Use: No    Review of Systems  Constitutional: Negative for fever. Cardiovascular: Negative for chest pain. Respiratory: Negative for shortness of breath. Gastrointestinal: Negative for abdominal pain, vomiting and diarrhea. Musculoskeletal: Positive for leg pain Neurological: Negative for headaches, focal  weakness or numbness.   10-point ROS otherwise negative.  ____________________________________________   PHYSICAL EXAM:  VITAL SIGNS: ED Triage Vitals  Enc Vitals Group     BP 08/08/15 1732 151/65 mmHg     Pulse Rate 08/08/15 1732 74     Resp 08/08/15 1732 18     Temp 08/08/15 1732 97.6 F (36.4 C)     Temp Source 08/08/15 1732 Oral     SpO2 08/08/15 1732 96 %     Weight 08/08/15 1737 104 lb 8 oz (47.4 kg)     Height --      Head Cir --      Peak Flow --      Pain Score 08/08/15 1724 8   Constitutional: Awake and alert. Not oriented. Demented. Eyes: Conjunctivae are normal. PERRL. Normal extraocular movements. ENT   Head: Normocephalic and atraumatic.   Nose: No congestion/rhinnorhea.   Mouth/Throat: Mucous membranes are moist.   Neck: No stridor. Hematological/Lymphatic/Immunilogical: No cervical lymphadenopathy. Cardiovascular: Normal rate, regular rhythm. Respiratory: Normal respiratory effort without tachypnea nor retractions. Breath sounds are clear and equal bilaterally. No wheezes/rales/rhonchi. Gastrointestinal: Soft and nontender. No distention. Genitourinary: Deferred Musculoskeletal: Bilateral lower extremity edema. Tenderness to palpation. No obvious overlying erythema. Neurologic:  Awake and alert. Normal speech. Not oriented. Moves all extremities. Sensation grossly intact. Skin:  Skin is warm, dry and intact. No rash noted. Psychiatric: Mood and affect are normal. Speech and behavior are normal. Patient exhibits appropriate insight and judgment.  ____________________________________________    LABS (pertinent positives/negatives)  Labs Reviewed  BASIC METABOLIC PANEL - Abnormal; Notable for the following:    Sodium 148 (*)    Glucose, Bld 153 (*)    BUN 46 (*)    Creatinine, Ser 1.90 (*)    Calcium 8.8 (*)    GFR calc non Af Amer 22 (*)    GFR calc Af Amer 26 (*)    All other components within normal limits  CBC - Abnormal; Notable  for the following:    WBC 13.8 (*)    RBC  3.28 (*)    Hemoglobin 8.9 (*)    HCT 28.2 (*)    MCHC 31.4 (*)    RDW 18.0 (*)    All other components within normal limits  PROTIME-INR - Abnormal; Notable for the following:    Prothrombin Time 15.1 (*)    All other components within normal limits  APTT - Abnormal; Notable for the following:    aPTT 37 (*)    All other components within normal limits  CBC  HEPARIN LEVEL (UNFRACTIONATED)     ____________________________________________   EKG  None  ____________________________________________    RADIOLOGY  Lower extremity venous US IMPRESSION: Positive for bilateral lower extremity deep venous thrombosis. There is extensive occlusive thrombus throughout the left lower extremity extending from the calf to the left common femoral vein. Thrombus in the right common femoral vein.   ____________________________________________   PROCEDURES  Procedure(s) performed: None  Critical Care performed: No  ____________________________________________   INITIAL IMPRESSION / ASSESSMENT AND PLAN / ED COURSE  Pertinent labs & imaging results that were available during my care of the patient were reviewed by me and considered in my medical decision making (see chart for details).  Patient presented to the emergency department today because of concerns for bilateral leg swelling and DVT seen on ultrasound performed as an outpatient. Patient unable to give any history. Will plan on starting heparin admission to hospital service.  ____________________________________________   FINAL CLINICAL IMPRESSION(S) / ED DIAGNOSES  Final diagnoses:  DVT (deep venous thrombosis), bilateral     Phineas Semen, MD 08/08/15 2137

## 2015-08-08 NOTE — H&P (Signed)
[facility] [place] at [facility]   PATIENT NAME: Bailey Lindsey    MR#:  [id number]  DATE OF BIRTH:  16-Sep-1923  DATE OF ADMISSION:  08/08/2015  PRIMARY CARE PHYSICIAN: Novella Olive, NP   REQUESTING/REFERRING PHYSICIAN: Dr Derrill Kay  CHIEF COMPLAINT:  Bilateral lower extremity swelling for 2 days. Dementia  HISTORY OF PRESENT ILLNESS:  Bailey Lindsey  is a 82 y.o. female with a known history of dementia, rheumatoid arthritis, hypertension, CAD, comes to the emergency room with bilateral lower extremity swelling from primary care physician's office. Patient underwent ultrasound of the lower extremity and was found to have bilateral lower extremity DVT. She recently had a long road trip back and forth to [place] this past week according to the daughter. Patient has dementia and does not give much history or review of system. She denies any chest pain, leg pain or shortness of breath. Patient is being admitted for further eval, she management. She she is on IV heparin drip at present. There is no history of any bleeding, hematuria and a blood per rectum. PAST MEDICAL HISTORY:   Past Medical History  Diagnosis Date  . RA (rheumatoid arthritis) (HCC)   . Glaucoma     Followed by [facility]  . Hypertension   . Thyroid disease   . CAD (coronary artery disease)     PAST SURGICAL HISTOIRY:   Past Surgical History  Procedure Laterality Date  . Abdominal hysterectomy    . Coronary artery bypass graft  2005    triple bypass  . Right hip replacement    . Dislocated left shoulder  2008  . Eye surgery      Cataract surgery    SOCIAL HISTORY:   Social History  Substance Use Topics  . Smoking status: Never Smoker   . Smokeless tobacco: Never Used  . Alcohol Use: No    FAMILY HISTORY:  No family history on file.  DRUG ALLERGIES:   Allergies  Allergen Reactions  . Caffeine Other (See Comments)    Reaction:  Unknown   . Codeine Other (See  Comments)    Reaction:  Unknown   . Fish Oil Other (See Comments)    Reaction:  Unknown   . Librium [Chlordiazepoxide] Other (See Comments)    Reaction:  Unknown   . Penicillins Other (See Comments)    Reaction:  Unknown   . Shellfish Allergy Other (See Comments)    Reaction:  Unknown   . Latex Rash  . Minocycline Itching, Swelling and Rash    REVIEW OF SYSTEMS:  Review of Systems  Unable to perform ROS    MEDICATIONS AT HOME:   Prior to Admission medications   Medication Sig Start Date End Date Taking? Authorizing Provider  acetaminophen (TYLENOL) 500 MG tablet Take 1,000 mg by mouth 2 (two) times daily.    Yes Historical Provider, MD  alendronate (FOSAMAX) 70 MG tablet Take 70 mg by mouth once a week. Pt takes on Thursday. Take with a full glass of water on an empty stomach.   Yes Historical Provider, MD  amLODipine (NORVASC) 10 MG tablet Take 10 mg by mouth daily.   Yes Historical Provider, MD  aspirin 81 MG chewable tablet Chew 81 mg by mouth daily.   Yes Historical Provider, MD  atropine 1 % ophthalmic solution Place 1 drop into the right eye 2 (two) times daily.   Yes Historical Provider, MD  brimonidine-timolol (COMBIGAN) 0.2-0.5 % ophthalmic solution Place 1  drop into the right eye 2 (two) times daily.    Yes Historical Provider, MD  Carboxymethylcellul-Glycerin (OPTIVE) 0.5-0.9 % SOLN Apply 1-2 drops to eye 4 (four) times daily as needed (dryness/irritation).   Yes Historical Provider, MD  cholecalciferol (VITAMIN D) 1000 units tablet Take 2,000 Units by mouth daily.   Yes Historical Provider, MD  enalapril (VASOTEC) 5 MG tablet Take 5 mg by mouth daily.   Yes Historical Provider, MD  levothyroxine (SYNTHROID, LEVOTHROID) 75 MCG tablet Take 75 mcg by mouth daily before breakfast.   Yes Historical Provider, MD  loratadine (CLARITIN) 10 MG tablet Take 10 mg by mouth daily.   Yes Historical Provider, MD  memantine (NAMENDA XR) 28 MG CP24 24 hr capsule Take 28 mg by mouth  daily.   Yes Historical Provider, MD  metoprolol (LOPRESSOR) 50 MG tablet Take 50 mg by mouth 2 (two) times daily.   Yes Historical Provider, MD  polyethylene glycol (MIRALAX / GLYCOLAX) packet Take 17 g by mouth daily as needed for moderate constipation.   Yes Historical Provider, MD  pravastatin (PRAVACHOL) 40 MG tablet Take 40 mg by mouth at bedtime.    Yes Historical Provider, MD  predniSONE (DELTASONE) 5 MG tablet Take 5 mg by mouth daily.   Yes Historical Provider, MD  sennosides-docusate sodium (SENOKOT-S) 8.6-50 MG tablet Take 2 tablets by mouth 2 (two) times daily.   Yes Historical Provider, MD  sodium chloride (OCEAN) 0.65 % SOLN nasal spray Place 1 spray into both nostrils as needed for congestion.   Yes Historical Provider, MD  travoprost, benzalkonium, (TRAVATAN) 0.004 % ophthalmic solution Place 1 drop into both eyes at bedtime.   Yes Historical Provider, MD  traZODone (DESYREL) 50 MG tablet Take 12.5 mg by mouth at bedtime as needed for sleep.   Yes Historical Provider, MD  Zinc Oxide (DESITIN) 13 % CREA Apply 1 application topically 2 (two) times daily as needed (for irritation).   Yes Historical Provider, MD      VITAL SIGNS:  Blood pressure 145/70, pulse 73, temperature 97.6 F (36.4 C), temperature source Oral, resp. rate 16, weight 47.4 kg (104 lb 8 oz), SpO2 99 %.  PHYSICAL EXAMINATION:  GENERAL:  82 y.o.-year-old patient lying in the bed with no acute distress.  EYES: Pupils equal, round, reactive to light and accommodation. No scleral icterus. Extraocular muscles intact.  HEENT: Head atraumatic, normocephalic. Oropharynx and nasopharynx clear.  NECK:  Supple, no jugular venous distention. No thyroid enlargement, no tenderness.  LUNGS: Normal breath sounds bilaterally, no wheezing, rales,rhonchi or crepitation. No use of accessory muscles of respiration.  CARDIOVASCULAR: S1, S2 normal. No murmurs, rubs, or gallops.  ABDOMEN: Soft, nontender, nondistended. Bowel sounds  present. No organomegaly or mass.  EXTREMITIES: ++ pedal edema, no cyanosis, or clubbing.  NEUROLOGIC: Cranial nerves II through XII are intact. Muscle strength 5/5 in all extremities. Sensation intact. Gait not checked.  PSYCHIATRIC: patient is alert. SKIN: No obvious rash, lesion, or ulcer.   LABORATORY PANEL:   CBC  Recent Labs Lab 08/08/15 1755  WBC 13.8*  HGB 8.9*  HCT 28.2*  PLT 163   ------------------------------------------------------------------------------------------------------------------  Chemistries   Recent Labs Lab 08/08/15 1755  NA 148*  K 4.1  CL 109  CO2 26  GLUCOSE 153*  BUN 46*  CREATININE 1.90*  CALCIUM 8.8*    RADIOLOGY:  US Venous Img Lower Unilateral Left  08/08/2015  CLINICAL DATA:  Left leg swelling and pain.  Recent traveling. EXAM: LEFT LOWER EXTREMITY  VENOUS DOPPLER ULTRASOUND TECHNIQUE: Gray-scale sonography with graded compression, as well as color Doppler and duplex ultrasound, were performed to evaluate the deep venous system from the level of the common femoral vein through the popliteal and proximal calf veins. Spectral Doppler was utilized to evaluate flow at rest and with distal augmentation maneuvers. COMPARISON:  None. FINDINGS: There is echogenic thrombus in the right common femoral vein. Echogenic thrombus in the left common femoral vein does not compress. There is thrombus at the left saphenofemoral junction. Echogenic thrombus throughout the left femoral vein and left popliteal vein. Evidence for thrombus in the visualized left calf veins, including in the left posterior tibial veins. There is occlusive thrombus in the left profunda femoral vein. The thrombus in the left common femoral vein, left saphenofemoral junction, left femoral vein and left popliteal vein appears to be occlusive. IMPRESSION: Positive for bilateral lower extremity deep venous thrombosis. There is extensive occlusive thrombus throughout the left lower extremity  extending from the calf to the left common femoral vein. Thrombus in the right common femoral vein. Electronically Signed   By: Richarda Overlie M.D.   On: 08/08/2015 17:12   IMPRESSION AND PLAN:  Bailey Lindsey  is a 82 y.o. female with a known history of dementia, rheumatoid arthritis, hypertension, CAD, comes to the emergency room with bilateral lower extremity swelling from primary care physician's office. Patient underwent ultrasound of the lower extremity and was found to have bilateral lower extremity DVT. She recently had a long road trip back and forth to [place] this past week according to the daughter.  1. Bilateral lower extremity DVT secondary to likely long extended road trip recently to [place] -Admit patient to medical floor -She is asymptomatic -Sats are more than 92% on room air -IV heparin drip. Change her to po eliquis from tomorrow. Pharmacy to dose. -Echo of the heart -Vascular consultation for evaluation of thrombolysis  2. Hypertension Continue home meds  3. Dementia  4. History of CAD  5. DVT prophylaxis already on IV heparin  All the records are reviewed and case discussed with ED provider. Management plans discussed with the patient, family and they are in agreement.  CODE STATUS: DO NOT RESUSCITATE. This was confirmed with patient's daughter, [name], who was presented in the emergency room.  TOTAL TIME TAKING CARE OF THIS PATIENT: 50 minutes.    Bailey Lindsey M.D on 08/08/2015 at 8:20 PM  Between 7am to 6pm - Pager - [phone]  After 6pm go to [email or web address] - password [email or web address] [facility]  [facility]  Office  [phone]  CC: Primary care physician; Novella Olive, NP

## 2015-08-08 NOTE — ED Notes (Signed)
Sent over from u/s with positive clot to entire left leg and upper right leg

## 2015-08-08 NOTE — Progress Notes (Signed)
ANTICOAGULATION CONSULT NOTE - Initial Consult  Pharmacy Consult for argatroban Indication: DVT  Allergies  Allergen Reactions  . Caffeine Other (See Comments)    Reaction:  Unknown   . Codeine Other (See Comments)    Reaction:  Unknown   . Fish Oil Other (See Comments)    Reaction:  Unknown   . Librium [Chlordiazepoxide] Other (See Comments)    Reaction:  Unknown   . Penicillins Other (See Comments)    Reaction:  Unknown   . Shellfish Allergy Other (See Comments)    Reaction:  Unknown   . Latex Rash  . Minocycline Itching, Swelling and Rash    Patient Measurements: Weight: 104 lb 8 oz (47.4 kg) Heparin Dosing Weight:   Vital Signs: Temp: 97.6 F (36.4 C) (01/03 1732) Temp Source: Oral (01/03 1732) BP: 145/70 mmHg (01/03 2016) Pulse Rate: 73 (01/03 2016)  Labs:  Recent Labs  08/08/15 1755  HGB 8.9*  HCT 28.2*  PLT 163  APTT 37*  LABPROT 15.1*  INR 1.17  CREATININE 1.90*    Estimated Creatinine Clearance: 15 mL/min (by C-G formula based on Cr of 1.9).   Medical History: Past Medical History  Diagnosis Date  . RA (rheumatoid arthritis) (HCC)   . Glaucoma     Followed by [facility]  . Hypertension   . Thyroid disease   . CAD (coronary artery disease)     Medications:  Infusions:  . argatroban      Assessment: 89 yof with allergic reaction after receiving heparin. Starting argatroban for extensive DVTs.   Goal of Therapy:  aPTT 50 to 90 seconds Monitor platelets by anticoagulation protocol: Yes   Plan:  Argatroban 2 mcg/kg/min to begin, pharmacy will monitor Q2H until therapeutic PTT achieved per protocol.  Carola Frost, Pharm.D., BCPS Clinical Pharmacist 08/08/2015,10:09 PM

## 2015-08-09 ENCOUNTER — Inpatient Hospital Stay (HOSPITAL_COMMUNITY)
Admit: 2015-08-09 | Discharge: 2015-08-09 | Disposition: A | Discharge status: Home / Self Care | Payer: Medicare (Managed Care) | Attending: Internal Medicine | Admitting: Internal Medicine

## 2015-08-09 DIAGNOSIS — R079 Chest pain, unspecified: Secondary | ICD-10-CM

## 2015-08-09 LAB — CBC
HEMATOCRIT: 23.6 % — AB (ref 35.0–47.0)
Hemoglobin: 7.4 g/dL — ABNORMAL LOW (ref 12.0–16.0)
MCH: 26.9 pg (ref 26.0–34.0)
MCHC: 31.5 g/dL — ABNORMAL LOW (ref 32.0–36.0)
MCV: 85.4 fL (ref 80.0–100.0)
Platelets: 150 10*3/uL (ref 150–440)
RBC: 2.77 MIL/uL — AB (ref 3.80–5.20)
RDW: 17.9 % — AB (ref 11.5–14.5)
WBC: 12.8 10*3/uL — AB (ref 3.6–11.0)

## 2015-08-09 LAB — CREATININE, SERUM
Creatinine, Ser: 1.52 mg/dL — ABNORMAL HIGH (ref 0.44–1.00)
GFR, EST AFRICAN AMERICAN: 34 mL/min — AB (ref >60)
GFR, EST NON AFRICAN AMERICAN: 29 mL/min — AB (ref >60)

## 2015-08-09 LAB — APTT
APTT: 85 s — AB (ref 24–36)
APTT: 101 s — AB (ref 24–36)
APTT: 87 s — AB (ref 24–36)
aPTT: 83 seconds — ABNORMAL HIGH (ref 24–36)
aPTT: 114 seconds — ABNORMAL HIGH (ref 24–36)
aPTT: 63 seconds — ABNORMAL HIGH (ref 24–36)

## 2015-08-09 LAB — PROTIME-INR
INR: 3.22
Prothrombin Time: 32.3 seconds — ABNORMAL HIGH (ref 11.4–15.0)

## 2015-08-09 MED ORDER — WARFARIN SODIUM 2 MG PO TABS
2.5000 mg | ORAL_TABLET | Freq: Every day | ORAL | Status: DC
  Administered 2015-08-09 – 2015-08-12 (×4): 2.5 mg via ORAL
  Filled 2015-08-09 (×4): qty 1

## 2015-08-09 MED ORDER — ENSURE ENLIVE PO LIQD
237.0000 mL | Freq: Three times a day (TID) | ORAL | Status: DC
  Administered 2015-08-09 – 2015-08-16 (×19): 237 mL via ORAL

## 2015-08-09 MED ORDER — WARFARIN - PHARMACIST DOSING INPATIENT
Freq: Every day | Status: DC
  Administered 2015-08-09 – 2015-08-16 (×8)

## 2015-08-09 NOTE — Progress Notes (Signed)
ANTICOAGULATION CONSULT NOTE - Initial Consult  Pharmacy Consult for argatroban Indication: DVT  Allergies  Allergen Reactions  . Caffeine Other (See Comments)    Reaction:  Unknown   . Codeine Other (See Comments)    Reaction:  Unknown   . Fish Oil Other (See Comments)    Reaction:  Unknown   . Librium [Chlordiazepoxide] Other (See Comments)    Reaction:  Unknown   . Penicillins Other (See Comments)    Reaction:  Unknown   . Shellfish Allergy Other (See Comments)    Reaction:  Unknown   . Latex Rash  . Minocycline Itching, Swelling and Rash    Patient Measurements: Height: 5\' 3"  (160 cm) Weight: 108 lb 9.6 oz (49.261 kg) IBW/kg (Calculated) : 52.4 Heparin Dosing Weight:   Vital Signs: Temp: 98 F (36.7 C) (01/04 1345) Temp Source: Oral (01/04 1345) BP: 137/64 mmHg (01/04 1345) Pulse Rate: 69 (01/04 1345)  Labs:  Recent Labs  08/08/15 1755 08/08/15 2224  08/09/15 0443 08/09/15 0830 08/09/15 1208 08/09/15 1435  HGB 8.9* 7.7*  --  7.4*  --   --   --   HCT 28.2* 24.4*  --  23.6*  --   --   --   PLT 163 134*  --  150  --   --   --   APTT 37* 157*  < > 114* 85* 101* 87*  LABPROT 15.1*  --   --   --   --  32.3*  --   INR 1.17  --   --   --   --  3.22  --   CREATININE 1.90*  --   --   --  1.52*  --   --   < > = values in this interval not displayed.  Estimated Creatinine Clearance: 19.5 mL/min (by C-G formula based on Cr of 1.52).   Medical History: Past Medical History  Diagnosis Date  . RA (rheumatoid arthritis) (HCC)   . Glaucoma     Followed by [facility]  . Hypertension   . Thyroid disease   . CAD (coronary artery disease)     Medications:  Infusions:  . argatroban 1 mcg/kg/min (08/09/15 1329)    Assessment: 89 yof with allergic reaction after receiving heparin. Starting argatroban for extensive DVTs. Currently at 1.26mcg/kg/min.   12:00 aPTT resulted at 101  Goal of Therapy:  aPTT 50 to 90 seconds Monitor platelets by  anticoagulation protocol: Yes   Plan:  APPT is therapeutic so will continue argatroban at current rate of argatroban 1 mcg/kg/min and recheck aptt in 3 hours.   4m D, Pharm.D., BCPS Clinical Pharmacist 08/09/2015,3:41 PM

## 2015-08-09 NOTE — Progress Notes (Signed)
ANTICOAGULATION CONSULT NOTE - Initial Consult  Pharmacy Consult for argatroban Indication: DVT  Allergies  Allergen Reactions  . Caffeine Other (See Comments)    Reaction:  Unknown   . Codeine Other (See Comments)    Reaction:  Unknown   . Fish Oil Other (See Comments)    Reaction:  Unknown   . Librium [Chlordiazepoxide] Other (See Comments)    Reaction:  Unknown   . Penicillins Other (See Comments)    Reaction:  Unknown   . Shellfish Allergy Other (See Comments)    Reaction:  Unknown   . Latex Rash  . Minocycline Itching, Swelling and Rash    Patient Measurements: Height: 5\' 3"  (160 cm) Weight: 108 lb 9.6 oz (49.261 kg) IBW/kg (Calculated) : 52.4 Heparin Dosing Weight:   Vital Signs: Temp: 98.3 F (36.8 C) (01/04 0934) Temp Source: Oral (01/04 0934) BP: 126/67 mmHg (01/04 0934) Pulse Rate: 85 (01/04 0934)  Labs:  Recent Labs  08/08/15 1755 08/08/15 2224 08/09/15 0123 08/09/15 0443 08/09/15 0830  HGB 8.9* 7.7*  --  7.4*  --   HCT 28.2* 24.4*  --  23.6*  --   PLT 163 134*  --  150  --   APTT 37* 157* 83* 114* 85*  LABPROT 15.1*  --   --   --   --   INR 1.17  --   --   --   --   CREATININE 1.90*  --   --   --  1.52*    Estimated Creatinine Clearance: 19.5 mL/min (by C-G formula based on Cr of 1.52).   Medical History: Past Medical History  Diagnosis Date  . RA (rheumatoid arthritis) (HCC)   . Glaucoma     Followed by [facility]  . Hypertension   . Thyroid disease   . CAD (coronary artery disease)     Medications:  Infusions:  . argatroban 1.5 mcg/kg/min (08/09/15 0543)    Assessment: 89 yof with allergic reaction after receiving heparin. Starting argatroban for extensive DVTs.   Goal of Therapy:  aPTT 50 to 90 seconds Monitor platelets by anticoagulation protocol: Yes   Plan:  APTT therapeutic at 85. Will continue with current rate 1.71mcg/kg/min and recheck aPTT in 3 hours to confirm.   4m, Pharm.D., BCPS Clinical  Pharmacist 08/09/2015,11:15 AM

## 2015-08-09 NOTE — Consult Note (Signed)
[facility] Vascular Consult Note  MRN : [id number]  Bailey Lindsey is a 82 y.o. (1923-09-28) female who presents with chief complaint of bilateral lower extremity swelling.  Chief Complaint  Patient presents with  . Leg Pain   History of Present Illness:  Interviewed and examined with son at bedside as patient has dementia and is a poor historian.   Bailey Lindsey is a 82 y.o. female with a known history of dementia, rheumatoid arthritis, hypertension, CAD, who presented to the emergency room with bilateral lower extremity swelling from her primary care physician's office. Patient underwent ultrasound of the lower extremity and was found to have bilateral lower extremity DVT. She recently had a long road trip back and forth to [place] this past week according to family.  Patient resting comfortably in bed with son at bedside. Son states the patients bilateral lower extremity swelling has improved greatly since admission. Her son denies any history of prior DVT, trauma or bleeding / clotting disorders. Son feels his mother is not in any pain and is comfortable. Son denies signs of chest pain or SOB from his mother.   Patient had allergic reaction to IV heparin she was started on and was switched to IV argatroban. As per her son, no reaction to this medication.   Vascular surgery was consulted by the primary team (Dr. Allena Katz), for any further recommendations (lysis, filter, etc).  Current Facility-Administered Medications  Medication Dose Route Frequency Provider Last Rate Last Dose  . 0.9 %  sodium chloride infusion  250 mL Intravenous PRN Enedina Finner, MD      . acetaminophen (TYLENOL) tablet 650 mg  650 mg Oral Q6H PRN Enedina Finner, MD       Or  . acetaminophen (TYLENOL) suppository 650 mg  650 mg Rectal Q6H PRN Enedina Finner, MD      . acetaminophen (TYLENOL) tablet 1,000 mg  1,000 mg Oral BID Enedina Finner, MD   1,000 mg at 08/09/15 0937  . amLODipine (NORVASC)  tablet 10 mg  10 mg Oral Daily Enedina Finner, MD   10 mg at 08/09/15 0937  . argatroban 1 mg/mL infusion  1 mcg/kg/min Intravenous Continuous Katha Hamming, MD 2.8 mL/hr at 08/09/15 1329 1 mcg/kg/min at 08/09/15 1329  . aspirin chewable tablet 81 mg  81 mg Oral Daily Enedina Finner, MD   81 mg at 08/09/15 3546  . atropine 1 % ophthalmic solution 1 drop  1 drop Right Eye BID Enedina Finner, MD   1 drop at 08/09/15 0940  . brimonidine (ALPHAGAN) 0.2 % ophthalmic solution 1 drop  1 drop Right Eye BID Enedina Finner, MD   1 drop at 08/09/15 0940   And  . timolol (TIMOPTIC) 0.5 % ophthalmic solution 1 drop  1 drop Right Eye BID Enedina Finner, MD   1 drop at 08/09/15 0939  . cholecalciferol (VITAMIN D) tablet 2,000 Units  2,000 Units Oral Daily Enedina Finner, MD   2,000 Units at 08/09/15 [phone]  . enalapril (VASOTEC) tablet 5 mg  5 mg Oral Daily Enedina Finner, MD   5 mg at 08/09/15 0935  . feeding supplement (ENSURE ENLIVE) (ENSURE ENLIVE) liquid 237 mL  237 mL Oral TID WC Katha Hamming, MD   237 mL at 08/09/15 1717  . latanoprost (XALATAN) 0.005 % ophthalmic solution 1 drop  1 drop Both Eyes QHS Enedina Finner, MD   1 drop at 08/09/15 0031  . levothyroxine (SYNTHROID, LEVOTHROID) tablet 75 mcg  75 mcg  Oral QAC breakfast Enedina Finner, MD   75 mcg at 08/09/15 0935  . loratadine (CLARITIN) tablet 10 mg  10 mg Oral Daily Enedina Finner, MD   10 mg at 08/09/15 0937  . memantine (NAMENDA XR) 24 hr capsule 28 mg  28 mg Oral Daily Enedina Finner, MD   28 mg at 08/09/15 0938  . metoprolol (LOPRESSOR) tablet 50 mg  50 mg Oral BID Enedina Finner, MD   50 mg at 08/09/15 0937  . ondansetron (ZOFRAN) tablet 4 mg  4 mg Oral Q6H PRN Enedina Finner, MD       Or  . ondansetron (ZOFRAN) injection 4 mg  4 mg Intravenous Q6H PRN Enedina Finner, MD      . polyethylene glycol (MIRALAX / GLYCOLAX) packet 17 g  17 g Oral Daily PRN Enedina Finner, MD      . polyvinyl alcohol (LIQUIFILM TEARS) 1.4 % ophthalmic solution 1-2 drop  1-2 drop Both Eyes QID PRN Enedina Finner, MD       . pravastatin (PRAVACHOL) tablet 40 mg  40 mg Oral QHS Enedina Finner, MD   40 mg at 08/09/15 0029  . predniSONE (DELTASONE) tablet 5 mg  5 mg Oral QAC breakfast Enedina Finner, MD   5 mg at 08/09/15 0935  . senna-docusate (Senokot-S) tablet 2 tablet  2 tablet Oral BID Enedina Finner, MD   2 tablet at 08/09/15 0935  . sodium chloride 0.9 % injection 3 mL  3 mL Intravenous Q12H Enedina Finner, MD   3 mL at 08/09/15 0030  . sodium chloride 0.9 % injection 3 mL  3 mL Intravenous PRN Enedina Finner, MD      . warfarin (COUMADIN) tablet 2.5 mg  2.5 mg Oral q1800 Katha Hamming, MD      . Warfarin - Pharmacist Dosing Inpatient   Does not apply [id number] Katha Hamming, MD      . zinc oxide 20 % ointment 1 application  1 application Topical BID PRN Enedina Finner, MD      . zolpidem (AMBIEN) tablet 5 mg  5 mg Oral QHS PRN Enedina Finner, MD        Past Medical History  Diagnosis Date  . RA (rheumatoid arthritis) (HCC)   . Glaucoma     Followed by [facility]  . Hypertension   . Thyroid disease   . CAD (coronary artery disease)     Past Surgical History  Procedure Laterality Date  . Abdominal hysterectomy    . Coronary artery bypass graft  2005    triple bypass  . Right hip replacement    . Dislocated left shoulder  2008  . Eye surgery      Cataract surgery    Social History Social History  Substance Use Topics  . Smoking status: Never Smoker   . Smokeless tobacco: Never Used  . Alcohol Use: No    Family History History reviewed. No pertinent family history.  Patient with dementia and is a poor historian. As per son denies any family history DVT, bleeding or clotting disorders.   Allergies  Allergen Reactions  . Caffeine Other (See Comments)    Reaction:  Unknown   . Codeine Other (See Comments)    Reaction:  Unknown   . Fish Oil Other (See Comments)    Reaction:  Unknown   . Librium [Chlordiazepoxide] Other (See Comments)    Reaction:  Unknown   . Penicillins Other (See Comments)     Reaction:  Unknown   .  Shellfish Allergy Other (See Comments)    Reaction:  Unknown   . Latex Rash  . Minocycline Itching, Swelling and Rash   REVIEW OF SYSTEMS (Negative unless checked) Patient with dementia and is a poor historian. As per son  Constitutional: Weight loss  Fever  Chills Cardiac: Chest pain   Chest pressure   Palpitations   Shortness of breath when laying flat   Shortness of breath at rest   Shortness of breath with exertion. Vascular:  Pain in legs with walking   Pain in legs at rest   Pain in legs when laying flat   Claudication   Pain in feet when walking  Pain in feet at rest  Pain in feet when laying flat   History of DVT   Phlebitis   Swelling in legs   Varicose veins   Non-healing ulcers Pulmonary:   Uses home oxygen   Productive cough   Hemoptysis   Wheeze  COPD   Asthma Neurologic:  Dizziness  Blackouts   Seizures   History of stroke   History of TIA  Aphasia   Temporary blindness   Dysphagia   Weakness or numbness in arms   Weakness or numbness in legs Musculoskeletal:  Arthritis   Joint swelling   Joint pain   Low back pain Hematologic:  Easy bruising  Easy bleeding   Hypercoagulable state   Anemic  Hepatitis Gastrointestinal:  Blood in stool   Vomiting blood  Gastroesophageal reflux/heartburn   Difficulty swallowing. Genitourinary:  Chronic kidney disease   Difficult urination  Frequent urination  Burning with urination   Blood in urine Skin:  Rashes   Ulcers   Wounds Psychological:  History of anxiety    History of major depression.  Physical Examination  Filed Vitals:   08/08/15 2318 08/09/15 0452 08/09/15 0934 08/09/15 1345  BP: 132/66 134/66 126/67 137/64  Pulse: 76 76 85 69  Temp: 97.6 F (36.4 C) 98.2 F (36.8 C) 98.3 F (36.8 C) 98 F (36.7 C)  TempSrc: Oral Oral Oral Oral  Resp: Height:  (1.6 m)      Weight: 49.261 kg (108 lb 9.6 oz)     SpO2: 100% 100% 100% 100%   Body mass index is 19.24 kg/(m^2).   Gen:  Has dementia, poor historian, NAD Head: [place]/AT, No temporalis wasting. Prominent temp pulse not noted. Ear/Nose/Throat: Hearing grossly intact, nares w/o erythema or drainage, oropharynx w/o Erythema/Exudate Eyes: PERRLA, EOMI.  Neck: Supple, no nuchal rigidity.  No bruit or JVD.  Pulmonary:  Good air movement, clear to auscultation bilaterally.  Cardiac: RRR, normal S1, S2, no Murmurs, rubs or gallops. Vascular:  Vessel Right Left  Radial Palpable Palpable  Ulnar Palpable Palpable  Brachial Palpable Palpable  Carotid Palpable, without bruit Palpable, without bruit  Aorta Not palpable N/A  Femoral Palpable Palpable  Popliteal Palpable Palpable  PT Palpable Palpable  DP Palpable Palpable  Left Lower Extremity: Thigh soft, calf soft, no pain with dorsiflexion, minimal edema noted Right Lower Extremity: Thigh soft, calf soft, no pain with dorsiflexion, minimal edema noted Gastrointestinal: soft, non-tender/non-distended. No guarding/reflex. No masses, surgical incisions, or scars. Musculoskeletal: M/S 5/5 throughout.  Extremities without ischemic changes.  No deformity or atrophy. No edema. Neurologic: CN 2-12 intact. Pain and light touch intact in extremities.  Symmetrical.  Speech is fluent. Motor exam as listed above. Psychiatric: Has dementia, poor historian, Mood & affect appropriate for pt's clinical situation. Dermatologic: No rashes or  ulcers noted.  No cellulitis or open wounds. Lymph : No Cervical, Axillary, or Inguinal lymphadenopathy.  CBC Lab Results  Component Value Date   WBC 12.8* 08/09/2015   HGB 7.4* 08/09/2015   HCT 23.6* 08/09/2015   MCV 85.4 08/09/2015   PLT 150 08/09/2015   BMET    Component Value Date/Time   NA 148* 08/08/2015 1755   NA 139 04/25/2014 0852   K 4.1 08/08/2015 1755   K 4.1 04/25/2014 0852   CL 109 08/08/2015 1755   CL 115*  04/25/2014 0852   CO2 26 08/08/2015 1755   CO2 17* 04/25/2014 0852   GLUCOSE 153* 08/08/2015 1755   GLUCOSE 136* 04/25/2014 0852   BUN 46* 08/08/2015 1755   BUN 9 04/25/2014 0852   CREATININE 1.52* 08/09/2015 0830   CREATININE 0.79 04/25/2014 0852   CALCIUM 8.8* 08/08/2015 1755   CALCIUM 8.1* 04/25/2014 0852   GFRNONAA 29* 08/09/2015 0830   GFRNONAA >60 04/25/2014 0852   GFRAA 34* 08/09/2015 0830   GFRAA >60 04/25/2014 0852   Estimated Creatinine Clearance: 19.5 mL/min (by C-G formula based on Cr of 1.52).  COAG Lab Results  Component Value Date   INR 3.22 08/09/2015   INR 1.17 08/08/2015   Radiology US Venous Img Lower Unilateral Left  08/08/2015  CLINICAL DATA:  Left leg swelling and pain.  Recent traveling. EXAM: LEFT LOWER EXTREMITY VENOUS DOPPLER ULTRASOUND TECHNIQUE: Gray-scale sonography with graded compression, as well as color Doppler and duplex ultrasound, were performed to evaluate the deep venous system from the level of the common femoral vein through the popliteal and proximal calf veins. Spectral Doppler was utilized to evaluate flow at rest and with distal augmentation maneuvers. COMPARISON:  None. FINDINGS: There is echogenic thrombus in the right common femoral vein. Echogenic thrombus in the left common femoral vein does not compress. There is thrombus at the left saphenofemoral junction. Echogenic thrombus throughout the left femoral vein and left popliteal vein. Evidence for thrombus in the visualized left calf veins, including in the left posterior tibial veins. There is occlusive thrombus in the left profunda femoral vein. The thrombus in the left common femoral vein, left saphenofemoral junction, left femoral vein and left popliteal vein appears to be occlusive. IMPRESSION: Positive for bilateral lower extremity deep venous thrombosis. There is extensive occlusive thrombus throughout the left lower extremity extending from the calf to the left common femoral vein.  Thrombus in the right common femoral vein. Electronically Signed   By: Richarda Overlie M.D.   On: 08/08/2015 17:12   Assessment/Plan The patient is a 82 year old female with bilateral lower extremity DVT - on IV argatroban - stable. 1) Patient stable and asymptomatic with improvement in bilateral lower extremity pain and swelling - no thrombolysis indicated at this time. 2) Would transition to Eliquis (less incidence of bleeding or reoccurrence) when appropriate. If contraindication to oral anticoagulation or patient fails oral anticoagulation would consider IVC filter placement. 3) Patient to follow up in our office in one month after discharge for repeat ultrasound. (will place in discharge paperwork).  KIMBERLY A STEGMAYER, PA-C  08/09/2015 5:45 PM

## 2015-08-09 NOTE — Plan of Care (Signed)
Problem: Education: Goal: Knowledge of [facility] Education information/materials will improve Outcome: Progressing Welcome info discussed with pt and family.  Room orientation reviewed.   Pt and family recently made a long trip to [place]. Both legs with edema and dvts.  Argatroban infusing after allergic reaction to heparin in the ED. Continue to monitor.  Problem: Safety: Goal: Ability to remain free from injury will improve Outcome: Progressing Pt remains free from injury.  Falls precautions in place.  Family at bedside.

## 2015-08-09 NOTE — Care Management (Signed)
Admitted to [facility] with the diagnosis of DVT. Lives in the home with her daughter, Sherrilynn Gudgel Styles [phone] or [phone]). [facility] from 10:00am-3:00pm x 1 year. Hospice was in place about 1.5 years ago. No skilled facility. Mostly bedbound, uses wheelchair to get around. Decreased appetite since February. Will drink Ensure and tea in the home. Daughter says she called "the program and said she ate good."  [facility] will transport, if discharged before 2:00pm Gwenette Greet RN MSN CCM Care Management [phone]

## 2015-08-09 NOTE — Progress Notes (Signed)
ANTICOAGULATION CONSULT NOTE - Initial Consult  Pharmacy Consult for Coumadin Indication: DVT  Allergies  Allergen Reactions  . Caffeine Other (See Comments)    Reaction:  Unknown   . Codeine Other (See Comments)    Reaction:  Unknown   . Fish Oil Other (See Comments)    Reaction:  Unknown   . Librium [Chlordiazepoxide] Other (See Comments)    Reaction:  Unknown   . Penicillins Other (See Comments)    Reaction:  Unknown   . Shellfish Allergy Other (See Comments)    Reaction:  Unknown   . Latex Rash  . Minocycline Itching, Swelling and Rash    Patient Measurements: Height: 5\' 3"  (160 cm) Weight: 108 lb 9.6 oz (49.261 kg) IBW/kg (Calculated) : 52.4   Vital Signs: Temp: 98 F (36.7 C) (01/04 1345) Temp Source: Oral (01/04 1345) BP: 137/64 mmHg (01/04 1345) Pulse Rate: 69 (01/04 1345)  Labs:  Recent Labs  08/08/15 1755 08/08/15 2224  08/09/15 0443 08/09/15 0830 08/09/15 1208 08/09/15 1435  HGB 8.9* 7.7*  --  7.4*  --   --   --   HCT 28.2* 24.4*  --  23.6*  --   --   --   PLT 163 134*  --  150  --   --   --   APTT 37* 157*  < > 114* 85* 101* 87*  LABPROT 15.1*  --   --   --   --  32.3*  --   INR 1.17  --   --   --   --  3.22  --   CREATININE 1.90*  --   --   --  1.52*  --   --   < > = values in this interval not displayed.  Estimated Creatinine Clearance: 19.5 mL/min (by C-G formula based on Cr of 1.52).   Medical History: Past Medical History  Diagnosis Date  . RA (rheumatoid arthritis) (HCC)   . Glaucoma     [facility] by [facility]  . Hypertension   . Thyroid disease   . CAD (coronary artery disease)     Medications:  Scheduled:  . acetaminophen  1,000 mg Oral BID  . amLODipine  10 mg Oral Daily  . aspirin  81 mg Oral Daily  . atropine  1 drop Right Eye BID  . brimonidine  1 drop Right Eye BID   And  . timolol  1 drop Right Eye BID  . cholecalciferol  2,000 Units Oral Daily  . enalapril  5 mg Oral Daily  . feeding supplement (ENSURE  ENLIVE)  237 mL Oral TID WC  . latanoprost  1 drop Both Eyes QHS  . levothyroxine  75 mcg Oral QAC breakfast  . loratadine  10 mg Oral Daily  . memantine  28 mg Oral Daily  . metoprolol  50 mg Oral BID  . pravastatin  40 mg Oral QHS  . predniSONE  5 mg Oral QAC breakfast  . senna-docusate  2 tablet Oral BID  . sodium chloride  3 mL Intravenous Q12H  . warfarin  2.5 mg Oral q1800  . Warfarin - Pharmacist Dosing Inpatient   Does not apply q1800   Infusions:  . argatroban 1 mcg/kg/min (08/09/15 1329)    Assessment: 82 y/o F with B/L DVT on argatroban due to rxn to heparin to begin warfarin.   Goal of Therapy:  INR 2-3   Plan:  Will begin warfarin 2.5 mg daily. INR will  be falsely elevated while on argatroban. Will need to follow argatroban dosing policy to guide therapy. Patient will need at least 5 days of overlap. INR ordered for AM.   Bailey Lindsey D 08/09/2015,3:18 PM

## 2015-08-09 NOTE — Progress Notes (Signed)
*  PRELIMINARY RESULTS* Echocardiogram 2D Echocardiogram has been performed.  Bailey Lindsey 08/09/2015, 11:45 AM

## 2015-08-09 NOTE — Progress Notes (Signed)
ANTICOAGULATION CONSULT NOTE - Initial Consult  Pharmacy Consult for argatroban Indication: DVT  Allergies  Allergen Reactions  . Caffeine Other (See Comments)    Reaction:  Unknown   . Codeine Other (See Comments)    Reaction:  Unknown   . Fish Oil Other (See Comments)    Reaction:  Unknown   . Librium [Chlordiazepoxide] Other (See Comments)    Reaction:  Unknown   . Penicillins Other (See Comments)    Reaction:  Unknown   . Shellfish Allergy Other (See Comments)    Reaction:  Unknown   . Latex Rash  . Minocycline Itching, Swelling and Rash    Patient Measurements: Height: 5\' 3"  (160 cm) Weight: 108 lb 9.6 oz (49.261 kg) IBW/kg (Calculated) : 52.4 Heparin Dosing Weight:   Vital Signs: Temp: 98.3 F (36.8 C) (01/04 0934) Temp Source: Oral (01/04 0934) BP: 126/67 mmHg (01/04 0934) Pulse Rate: 85 (01/04 0934)  Labs:  Recent Labs  08/08/15 1755 08/08/15 2224  08/09/15 0443 08/09/15 0830 08/09/15 1208  HGB 8.9* 7.7*  --  7.4*  --   --   HCT 28.2* 24.4*  --  23.6*  --   --   PLT 163 134*  --  150  --   --   APTT 37* 157*  < > 114* 85* 101*  LABPROT 15.1*  --   --   --   --  32.3*  INR 1.17  --   --   --   --  3.22  CREATININE 1.90*  --   --   --  1.52*  --   < > = values in this interval not displayed.  Estimated Creatinine Clearance: 19.5 mL/min (by C-G formula based on Cr of 1.52).   Medical History: Past Medical History  Diagnosis Date  . RA (rheumatoid arthritis) (HCC)   . Glaucoma     Followed by [facility]  . Hypertension   . Thyroid disease   . CAD (coronary artery disease)     Medications:  Infusions:  . argatroban 1.5 mcg/kg/min (08/09/15 1322)    Assessment: 89 yof with allergic reaction after receiving heparin. Starting argatroban for extensive DVTs. Currently at 1.20mcg/kg/min.   12:00 aPTT resulted at 101  Goal of Therapy:  aPTT 50 to 90 seconds Monitor platelets by anticoagulation protocol: Yes   Plan:  APTT  supratherapeutic at 85. Will decrease current rate to  1 mcg/kg/min and recheck aPTT in 3 hours. Spoke to RN about changes.   4m, Pharm.D., BCPS Clinical Pharmacist 08/09/2015,1:29 PM

## 2015-08-09 NOTE — Progress Notes (Signed)
ANTICOAGULATION CONSULT NOTE - Initial Consult  Pharmacy Consult for argatroban Indication: DVT  Allergies  Allergen Reactions  . Caffeine Other (See Comments)    Reaction:  Unknown   . Codeine Other (See Comments)    Reaction:  Unknown   . Fish Oil Other (See Comments)    Reaction:  Unknown   . Librium [Chlordiazepoxide] Other (See Comments)    Reaction:  Unknown   . Penicillins Other (See Comments)    Reaction:  Unknown   . Shellfish Allergy Other (See Comments)    Reaction:  Unknown   . Latex Rash  . Minocycline Itching, Swelling and Rash    Patient Measurements: Height: 5\' 3"  (160 cm) Weight: 108 lb 9.6 oz (49.261 kg) IBW/kg (Calculated) : 52.4 Heparin Dosing Weight:   Vital Signs: Temp: 98 F (36.7 C) (01/04 1345) Temp Source: Oral (01/04 1345) BP: 137/64 mmHg (01/04 1345) Pulse Rate: 69 (01/04 1345)  Labs:  Recent Labs  08/08/15 1755 08/08/15 2224  08/09/15 0443 08/09/15 0830 08/09/15 1208 08/09/15 1435  HGB 8.9* 7.7*  --  7.4*  --   --   --   HCT 28.2* 24.4*  --  23.6*  --   --   --   PLT 163 134*  --  150  --   --   --   APTT 37* 157*  < > 114* 85* 101* 87*  LABPROT 15.1*  --   --   --   --  32.3*  --   INR 1.17  --   --   --   --  3.22  --   CREATININE 1.90*  --   --   --  1.52*  --   --   < > = values in this interval not displayed.  Estimated Creatinine Clearance: 19.5 mL/min (by C-G formula based on Cr of 1.52).   Medical History: Past Medical History  Diagnosis Date  . RA (rheumatoid arthritis) (HCC)   . Glaucoma     Followed by [facility]  . Hypertension   . Thyroid disease   . CAD (coronary artery disease)     Medications:  Infusions:  . argatroban 1 mcg/kg/min (08/09/15 1329)    Assessment: 89 yof with allergic reaction after receiving heparin. Starting argatroban for extensive DVTs. Currently at 1.12mcg/kg/min.   14:30 aPTT resulted at 87  Goal of Therapy:  aPTT 50 to 90 seconds Monitor platelets by  anticoagulation protocol: Yes   Plan:  APTT therapeutic at 8. Will continue with current rate of 1 mcg/kg/min and recheck aPTT in 3 hours to confirm.   01/04:  APTT @ 14:30 = 87 Will continue this pt on current rate of 2.8 ml/hr and recheck aPTT on 1/05 with AM labs.    Monike Bragdon D, Pharm.D Clinical Pharmacist 08/09/2015,7:06 PM

## 2015-08-09 NOTE — Progress Notes (Signed)
ANTICOAGULATION CONSULT NOTE - Initial Consult  Pharmacy Consult for argatroban Indication: DVT  Allergies  Allergen Reactions  . Caffeine Other (See Comments)    Reaction:  Unknown   . Codeine Other (See Comments)    Reaction:  Unknown   . Fish Oil Other (See Comments)    Reaction:  Unknown   . Librium [Chlordiazepoxide] Other (See Comments)    Reaction:  Unknown   . Penicillins Other (See Comments)    Reaction:  Unknown   . Shellfish Allergy Other (See Comments)    Reaction:  Unknown   . Latex Rash  . Minocycline Itching, Swelling and Rash    Patient Measurements: Height: 5\' 3"  (160 cm) Weight: 108 lb 9.6 oz (49.261 kg) IBW/kg (Calculated) : 52.4 Heparin Dosing Weight:   Vital Signs: Temp: 98 F (36.7 C) (01/04 1345) Temp Source: Oral (01/04 1345) BP: 137/64 mmHg (01/04 1345) Pulse Rate: 69 (01/04 1345)  Labs:  Recent Labs  08/08/15 1755 08/08/15 2224  08/09/15 0443 08/09/15 0830 08/09/15 1208 08/09/15 1435  HGB 8.9* 7.7*  --  7.4*  --   --   --   HCT 28.2* 24.4*  --  23.6*  --   --   --   PLT 163 134*  --  150  --   --   --   APTT 37* 157*  < > 114* 85* 101* 87*  LABPROT 15.1*  --   --   --   --  32.3*  --   INR 1.17  --   --   --   --  3.22  --   CREATININE 1.90*  --   --   --  1.52*  --   --   < > = values in this interval not displayed.  Estimated Creatinine Clearance: 19.5 mL/min (by C-G formula based on Cr of 1.52).   Medical History: Past Medical History  Diagnosis Date  . RA (rheumatoid arthritis) (HCC)   . Glaucoma     Followed by [facility]  . Hypertension   . Thyroid disease   . CAD (coronary artery disease)     Medications:  Infusions:  . argatroban 1 mcg/kg/min (08/09/15 1329)    Assessment: 89 yof with allergic reaction after receiving heparin. Starting argatroban for extensive DVTs. Currently at 1.62mcg/kg/min.   14:30 aPTT resulted at 87  Goal of Therapy:  aPTT 50 to 90 seconds Monitor platelets by  anticoagulation protocol: Yes   Plan:  APTT therapeutic at 8. Will continue with current rate of 1 mcg/kg/min and recheck aPTT in 3 hours to confirm.    4m, Pharm.D., BCPS Clinical Pharmacist 08/09/2015,3:42 PM

## 2015-08-09 NOTE — Progress Notes (Signed)
ANTICOAGULATION CONSULT NOTE - Initial Consult  Pharmacy Consult for argatroban Indication: DVT  Allergies  Allergen Reactions  . Caffeine Other (See Comments)    Reaction:  Unknown   . Codeine Other (See Comments)    Reaction:  Unknown   . Fish Oil Other (See Comments)    Reaction:  Unknown   . Librium [Chlordiazepoxide] Other (See Comments)    Reaction:  Unknown   . Penicillins Other (See Comments)    Reaction:  Unknown   . Shellfish Allergy Other (See Comments)    Reaction:  Unknown   . Latex Rash  . Minocycline Itching, Swelling and Rash    Patient Measurements: Height: 5\' 3"  (160 cm) Weight: 108 lb 9.6 oz (49.261 kg) IBW/kg (Calculated) : 52.4 Heparin Dosing Weight:   Vital Signs: Temp: 98.2 F (36.8 C) (01/04 0452) Temp Source: Oral (01/04 0452) BP: 134/66 mmHg (01/04 0452) Pulse Rate: 76 (01/04 0452)  Labs:  Recent Labs  08/08/15 1755 08/08/15 2224 08/09/15 0123 08/09/15 0443  HGB 8.9* 7.7*  --  7.4*  HCT 28.2* 24.4*  --  23.6*  PLT 163 134*  --  150  APTT 37* 157* 83* 114*  LABPROT 15.1*  --   --   --   INR 1.17  --   --   --   CREATININE 1.90*  --   --   --     Estimated Creatinine Clearance: 15.6 mL/min (by C-G formula based on Cr of 1.9).   Medical History: Past Medical History  Diagnosis Date  . RA (rheumatoid arthritis) (HCC)   . Glaucoma     Followed by [facility]  . Hypertension   . Thyroid disease   . CAD (coronary artery disease)     Medications:  Infusions:  . argatroban 2 mcg/kg/min (08/08/15 2247)    Assessment: 89 yof with allergic reaction after receiving heparin. Starting argatroban for extensive DVTs.   Goal of Therapy:  aPTT 50 to 90 seconds Monitor platelets by anticoagulation protocol: Yes   Plan:  APTT supratherapeutic. Decrease to 1.5 mcg/kg/min and recheck aPTT in 3 hours.   10/06/15, Pharm.D., BCPS Clinical Pharmacist 08/09/2015,5:18 AM

## 2015-08-09 NOTE — Progress Notes (Signed)
[facility] - [place] at [facility]   PATIENT NAME: Bailey Lindsey    MR#:  [id number]  DATE OF BIRTH:  Nov 04, 1923  SUBJECTIVE: Admitted for bilateral leg DVT. Developed after road trip to [place]. Patient complains of leg pain but no chest pain, no shortness of breath. They'll up angioedema after heparin infusion so we stopped the hyperinflation right now she is on Arixtra, plan to restart plan to start Eliquis.  CHIEF COMPLAINT:   Chief Complaint  Patient presents with  . Leg Pain    REVIEW OF SYSTEMS:   ROS CONSTITUTIONAL: No fever, fatigue or weakness.  EYES: No blurred or double vision.  EARS, NOSE, AND THROAT: No tinnitus or ear pain.  RESPIRATORY: No cough, shortness of breath, wheezing or hemoptysis.  CARDIOVASCULAR: No chest pain, orthopnea, edema.  GASTROINTESTINAL: No nausea, vomiting, diarrhea or abdominal pain.  GENITOURINARY: No dysuria, hematuria.  ENDOCRINE: No polyuria, nocturia,  HEMATOLOGY: No anemia, easy bruising or bleeding SKIN: No rash or lesion. MUSCULOSKELETAL: Bilateral leg the calf tenderness present  NEUROLOGIC: No tingling, numbness, weakness.  PSYCHIATRY: No anxiety or depression.   DRUG ALLERGIES:   Allergies  Allergen Reactions  . Caffeine Other (See Comments)    Reaction:  Unknown   . Codeine Other (See Comments)    Reaction:  Unknown   . Fish Oil Other (See Comments)    Reaction:  Unknown   . Librium [Chlordiazepoxide] Other (See Comments)    Reaction:  Unknown   . Penicillins Other (See Comments)    Reaction:  Unknown   . Shellfish Allergy Other (See Comments)    Reaction:  Unknown   . Latex Rash  . Minocycline Itching, Swelling and Rash    VITALS:  Blood pressure 126/67, pulse 85, temperature 98.3 F (36.8 C), temperature source Oral, resp. rate 20, height 5\' 3"  (1.6 m), weight 49.261 kg (108 lb 9.6 oz), SpO2 100 %.  PHYSICAL EXAMINATION:  GENERAL:  82 y.o.-year-old patient lying in the bed with no  acute distress.  EYES: Pupils equal, round, reactive to light and accommodation. No scleral icterus. Extraocular muscles intact.  HEENT: Head atraumatic, normocephalic. Oropharynx and nasopharynx clear.  NECK:  Supple, no jugular venous distention. No thyroid enlargement, no tenderness.  LUNGS: Normal breath sounds bilaterally, no wheezing, rales,rhonchi or crepitation. No use of accessory muscles of respiration.  CARDIOVASCULAR: S1, S2 normal. No murmurs, rubs, or gallops.  ABDOMEN: Soft, nontender, nondistended. Bowel sounds present. No organomegaly or mass.  EXTREMITIES: No pedal edema, cyanosis, or clubbing. Tenderness to calfs bilaterally. NEUROLOGIC: Cranial nerves II through XII are intact. Muscle strength 5/5 in all extremities. Sensation intact. Gait not checked.  PSYCHIATRIC: The patient is alert and oriented x 3.  SKIN: No obvious rash, lesion, or ulcer.    LABORATORY PANEL:   CBC  Recent Labs Lab 08/09/15 0443  WBC 12.8*  HGB 7.4*  HCT 23.6*  PLT 150   ------------------------------------------------------------------------------------------------------------------  Chemistries   Recent Labs Lab 08/08/15 1755 08/09/15 0830  NA 148*  --   K 4.1  --   CL 109  --   CO2 26  --   GLUCOSE 153*  --   BUN 46*  --   CREATININE 1.90* 1.52*  CALCIUM 8.8*  --    ------------------------------------------------------------------------------------------------------------------  Cardiac Enzymes No results for input(s): TROPONINI in the last 168 hours. ------------------------------------------------------------------------------------------------------------------  RADIOLOGY:  10/07/15 Venous Img Lower Unilateral Left  08/08/2015  CLINICAL DATA:  Left leg swelling and pain.  Recent  traveling. EXAM: LEFT LOWER EXTREMITY VENOUS DOPPLER ULTRASOUND TECHNIQUE: Gray-scale sonography with graded compression, as well as color Doppler and duplex ultrasound, were performed to evaluate  the deep venous system from the level of the common femoral vein through the popliteal and proximal calf veins. Spectral Doppler was utilized to evaluate flow at rest and with distal augmentation maneuvers. COMPARISON:  None. FINDINGS: There is echogenic thrombus in the right common femoral vein. Echogenic thrombus in the left common femoral vein does not compress. There is thrombus at the left saphenofemoral junction. Echogenic thrombus throughout the left femoral vein and left popliteal vein. Evidence for thrombus in the visualized left calf veins, including in the left posterior tibial veins. There is occlusive thrombus in the left profunda femoral vein. The thrombus in the left common femoral vein, left saphenofemoral junction, left femoral vein and left popliteal vein appears to be occlusive. IMPRESSION: Positive for bilateral lower extremity deep venous thrombosis. There is extensive occlusive thrombus throughout the left lower extremity extending from the calf to the left common femoral vein. Thrombus in the right common femoral vein. Electronically Signed   By: Richarda Overlie M.D.   On: 08/08/2015 17:12    EKG:   Orders placed or performed during the hospital encounter of 06/08/15  . ED EKG  . ED EKG  . EKG 12-Lead  . EKG 12-Lead  . EKG    ASSESSMENT AND PLAN:   #1 acute DVT of the lower legs bilaterally: Likely precipitant factor in long journey to [place] recently. And recent immobilization; continue to check echocardiogram., Patient primary doctor called and said she prefers to Coumadin instead of Eliquis  for the patient. I called her back at 336, [phone] and left a message to call me back. Vascular consulted for possible filter placement versus thrombectomy. 2. Rheumatoid arthritis: #3 hypertension ; patient to be continued on Norvasc, Toprol  #4 hypothyroidism; continue Synthyroid. History of CAD Dementia   All the records are reviewed and case discussed with Care  Management/Social Workerr. Management plans discussed with the patient, family and they are in agreement.  CODE STATUS: full TOTAL TIME TAKING CARE OF THIS PATIENT: .   POSSIBLE D/C IN 1-2  DAYS, DEPENDING ON CLINICAL CONDITION.   Katha Hamming M.D on 08/09/2015 at 10:34 AM  Between 7am to 6pm - Pager - [phone]  After 6pm go to [email or web address] - password [email or web address] [facility]  [place] [facility]  Office  [phone]  CC: Primary care physician; Novella Olive, NP   Note: This dictation was prepared with Dragon dictation along with smaller phrase technology. Any transcriptional errors that result from this process are unintentional.

## 2015-08-09 NOTE — Progress Notes (Signed)
ANTICOAGULATION CONSULT NOTE - Initial Consult  Pharmacy Consult for argatroban Indication: DVT  Allergies  Allergen Reactions  . Caffeine Other (See Comments)    Reaction:  Unknown   . Codeine Other (See Comments)    Reaction:  Unknown   . Fish Oil Other (See Comments)    Reaction:  Unknown   . Librium [Chlordiazepoxide] Other (See Comments)    Reaction:  Unknown   . Penicillins Other (See Comments)    Reaction:  Unknown   . Shellfish Allergy Other (See Comments)    Reaction:  Unknown   . Latex Rash  . Minocycline Itching, Swelling and Rash    Patient Measurements: Height: 5\' 3"  (160 cm) Weight: 108 lb 9.6 oz (49.261 kg) IBW/kg (Calculated) : 52.4 Heparin Dosing Weight:   Vital Signs: Temp: 97.6 F (36.4 C) (01/03 2318) Temp Source: Oral (01/03 2318) BP: 132/66 mmHg (01/03 2318) Pulse Rate: 76 (01/03 2318)  Labs:  Recent Labs  08/08/15 1755 08/08/15 2224 08/09/15 0123  HGB 8.9* 7.7*  --   HCT 28.2* 24.4*  --   PLT 163 134*  --   APTT 37* 157* 83*  LABPROT 15.1*  --   --   INR 1.17  --   --   CREATININE 1.90*  --   --     Estimated Creatinine Clearance: 15.6 mL/min (by C-G formula based on Cr of 1.9).   Medical History: Past Medical History  Diagnosis Date  . RA (rheumatoid arthritis) (HCC)   . Glaucoma     Followed by [facility]  . Hypertension   . Thyroid disease   . CAD (coronary artery disease)     Medications:  Infusions:  . argatroban 2 mcg/kg/min (08/08/15 2247)    Assessment: 89 yof with allergic reaction after receiving heparin. Starting argatroban for extensive DVTs.   Goal of Therapy:  aPTT 50 to 90 seconds Monitor platelets by anticoagulation protocol: Yes   Plan:  First aPTT therapeutic. Continue current rate. Will recheck in 3 hours.    10/06/15, Pharm.D., BCPS Clinical Pharmacist 08/09/2015,1:57 AM

## 2015-08-09 NOTE — Progress Notes (Signed)
Initial Nutrition Assessment   INTERVENTION:   Meals and Snacks: Cater to patient preferences Medical Food Supplement Therapy: will recommend Ensure Enlive po TID, each supplement provides 350 kcal and 20 grams of protein   NUTRITION DIAGNOSIS:   Inadequate oral intake related to acute illness as evidenced by meal completion < 25%.  GOAL:   Patient will meet greater than or equal to 90% of their needs  MONITOR:    (Energy Intake, Electrolyte and Renal Profile, Anthropometrics, Digestive System)  REASON FOR ASSESSMENT:   Consult Poor PO  ASSESSMENT:   Pt admitted with BLE DVT secondary to long trip to [place] and back per MD note.  Past Medical History  Diagnosis Date  . RA (rheumatoid arthritis) (HCC)   . Glaucoma     Followed by [facility]  . Hypertension   . Thyroid disease   . CAD (coronary artery disease)      Diet Order:  DIET SOFT Room service appropriate?: Yes; Fluid consistency:: Thin    Current Nutrition: Pt nodded that she ate lunch, per documentation pt ate bites and sips. Per Nsg pt with poor po intake.   Food/Nutrition-Related History: Unable to clarify with pt. Per MST no decrease in appetite. Per Nsg, family asking about Ensure supplements to be sent.   Scheduled Medications:  . acetaminophen  1,000 mg Oral BID  . amLODipine  10 mg Oral Daily  . aspirin  81 mg Oral Daily  . atropine  1 drop Right Eye BID  . brimonidine  1 drop Right Eye BID   And  . timolol  1 drop Right Eye BID  . cholecalciferol  2,000 Units Oral Daily  . enalapril  5 mg Oral Daily  . feeding supplement (ENSURE ENLIVE)  237 mL Oral TID WC  . latanoprost  1 drop Both Eyes QHS  . levothyroxine  75 mcg Oral QAC breakfast  . loratadine  10 mg Oral Daily  . memantine  28 mg Oral Daily  . metoprolol  50 mg Oral BID  . pravastatin  40 mg Oral QHS  . predniSONE  5 mg Oral QAC breakfast  . senna-docusate  2 tablet Oral BID  . sodium chloride  3 mL Intravenous Q12H     Continuous Medications:  . argatroban 1 mcg/kg/min (08/09/15 1329)     Electrolyte/Renal Profile and Glucose Profile:   Recent Labs Lab 08/08/15 1755 08/09/15 0830  NA 148*  --   K 4.1  --   CL 109  --   CO2 26  --   BUN 46*  --   CREATININE 1.90* 1.52*  CALCIUM 8.8*  --   GLUCOSE 153*  --    Protein Profile: No results for input(s): ALBUMIN in the last 168 hours.  Gastrointestinal Profile: Last BM:  08/08/2015   Nutrition-Focused Physical Exam Findings:  Unable to complete Nutrition-Focused physical exam at this time. RD attempted and was able to assess mild-moderate depletion in shoulder but unable to clarify anything further as pt wanting to rest.   Weight Change: Pt reports weight of 105lbs. Current measured of 180lbs.  2% weight loss in 2 months per [facility] encounters.   Skin:   per chart review pt with healed pressure ulcer on coccyx   Height:   Ht Readings from Last 1 Encounters:  08/08/15 5\' 3"  (1.6 m)    Weight:   Wt Readings from Last 1 Encounters:  08/08/15 108 lb 9.6 oz (49.261 kg)   Wt Readings from  Last 10 Encounters:  08/08/15 108 lb 9.6 oz (49.261 kg)  06/08/15 110 lb 3.7 oz (50 kg)  04/18/14 98 lb 8 oz (44.679 kg)  03/28/14 96 lb 4 oz (43.659 kg)    BMI:  Body mass index is 19.24 kg/(m^2).  Estimated Nutritional Needs:   Kcal:  BEE: 727kcals, TEE: (IF 1.1-1.3)(AF 1.2) 960-1134kcals  Protein:  49-59g protein (1.0-1.2g/kg)  Fluid:  1230-1496mL of fluid (25-71mL/kg)  EDUCATION NEEDS:   No education needs identified at this time    MODERATE Care Level  Leda Quail, RD, LDN Pager [phone] Weekend/On-Call Pager [phone]

## 2015-08-10 LAB — CBC
HCT: 21.8 % — ABNORMAL LOW (ref 35.0–47.0)
Hemoglobin: 6.8 g/dL — ABNORMAL LOW (ref 12.0–16.0)
MCH: 26.6 pg (ref 26.0–34.0)
MCHC: 31.3 g/dL — ABNORMAL LOW (ref 32.0–36.0)
MCV: 85.1 fL (ref 80.0–100.0)
PLATELETS: 187 10*3/uL (ref 150–440)
RBC: 2.56 MIL/uL — AB (ref 3.80–5.20)
RDW: 17.2 % — AB (ref 11.5–14.5)
WBC: 12 10*3/uL — AB (ref 3.6–11.0)

## 2015-08-10 LAB — APTT: APTT: 79 s — AB (ref 24–36)

## 2015-08-10 LAB — PROTIME-INR
INR: 2.11
PROTHROMBIN TIME: 23.5 s — AB (ref 11.4–15.0)

## 2015-08-10 NOTE — Plan of Care (Signed)
Problem: Education: Goal: Knowledge of [facility] Education information/materials will improve Outcome: Progressing Has received General Education Handout. Oriented and Reoriented to Oncology unit.    Instructed and Demonstrated how to use phone to call RN and CNA for Assistance. Unable. Family remains at Bedside.  Problem: Safety: Goal: Ability to remain free from injury will improve Outcome: Progressing High fall risk with bed alarm activated. Instructed and Demonstrated how to use phone to call RN and CNA for Assistance. Unable. Remained free of injury of fall this shift. Family remains at bedside.

## 2015-08-10 NOTE — Progress Notes (Signed)
ANTICOAGULATION CONSULT NOTE - Initial Consult  Pharmacy Consult for argatroban Indication: DVT  Allergies  Allergen Reactions  . Caffeine Other (See Comments)    Reaction:  Unknown   . Codeine Other (See Comments)    Reaction:  Unknown   . Fish Oil Other (See Comments)    Reaction:  Unknown   . Librium [Chlordiazepoxide] Other (See Comments)    Reaction:  Unknown   . Penicillins Other (See Comments)    Reaction:  Unknown   . Shellfish Allergy Other (See Comments)    Reaction:  Unknown   . Latex Rash  . Minocycline Itching, Swelling and Rash    Patient Measurements: Height: 5\' 3"  (160 cm) Weight: 108 lb 9.6 oz (49.261 kg) IBW/kg (Calculated) : 52.4 Heparin Dosing Weight:   Vital Signs: Temp: 97.5 F (36.4 C) (01/05 0510) Temp Source: Oral (01/05 0510) BP: 124/62 mmHg (01/05 0510) Pulse Rate: 66 (01/05 0510)  Labs:  Recent Labs  08/08/15 1755 08/08/15 2224  08/09/15 0443 08/09/15 0830 08/09/15 1208 08/09/15 1435 08/09/15 1846 08/10/15 0442  HGB 8.9* 7.7*  --  7.4*  --   --   --   --  6.8*  HCT 28.2* 24.4*  --  23.6*  --   --   --   --  21.8*  PLT 163 134*  --  150  --   --   --   --  187  APTT 37* 157*  < > 114* 85* 101* 87* 63* 79*  LABPROT 15.1*  --   --   --   --  32.3*  --   --  23.5*  INR 1.17  --   --   --   --  3.22  --   --  2.11  CREATININE 1.90*  --   --   --  1.52*  --   --   --   --   < > = values in this interval not displayed.  Estimated Creatinine Clearance: 19.5 mL/min (by C-G formula based on Cr of 1.52).   Medical History: Past Medical History  Diagnosis Date  . RA (rheumatoid arthritis) (HCC)   . Glaucoma     Followed by [facility]  . Hypertension   . Thyroid disease   . CAD (coronary artery disease)     Medications:  Infusions:  . argatroban 1 mcg/kg/min (08/09/15 1329)    Assessment: 89 yof with allergic reaction after receiving heparin. Starting argatroban for extensive DVTs. Currently at 1.49mcg/kg/min.    14:30 aPTT resulted at 87  Goal of Therapy:  aPTT 50 to 90 seconds Monitor platelets by anticoagulation protocol: Yes   Plan:  APTT therapeutic. Will continue with current rate of 1 mcg/kg/min. Pharmacy will continue to monitor daily.   4m, Pharm.D., BCPS Clinical Pharmacist 08/10/2015,6:14 AM

## 2015-08-10 NOTE — Progress Notes (Addendum)
[facility] Phys[facility] - [place] at [facility]   PATIENT NAME: Bailey Lindsey    MR#:  [id number]  DATE OF BIRTH:  July 02, 1924  SUBJECTIVE: Admitted for bilateral leg DVT. Marland Kitchen Unable to give hisotry as she is demented.on agatroban and coumadin.INR more than 2 because of Agatroban.  CHIEF COMPLAINT:   Chief Complaint  Patient presents with  . Leg Pain    REVIEW OF SYSTEMS:   ROS CONSTITUTIONAL: No fever, fatigue or weakness.  EYES: No blurred or double vision.  EARS, NOSE, AND THROAT: No tinnitus or ear pain.  RESPIRATORY: No cough, shortness of breath, wheezing or hemoptysis.  CARDIOVASCULAR: No chest pain, orthopnea, edema.  GASTROINTESTINAL: No nausea, vomiting, diarrhea or abdominal pain.  GENITOURINARY: No dysuria, hematuria.  ENDOCRINE: No polyuria, nocturia,  HEMATOLOGY: No anemia, easy bruising or bleeding SKIN: No rash or lesion. MUSCULOSKELETAL: Bilateral leg the calf tenderness present  NEUROLOGIC: No tingling, numbness, weakness.  PSYCHIATRY: No anxiety or depression.   DRUG ALLERGIES:   Allergies  Allergen Reactions  . Caffeine Other (See Comments)    Reaction:  Unknown   . Codeine Other (See Comments)    Reaction:  Unknown   . Fish Oil Other (See Comments)    Reaction:  Unknown   . Librium [Chlordiazepoxide] Other (See Comments)    Reaction:  Unknown   . Penicillins Other (See Comments)    Reaction:  Unknown   . Shellfish Allergy Other (See Comments)    Reaction:  Unknown   . Latex Rash  . Minocycline Itching, Swelling and Rash    VITALS:  Blood pressure 153/67, pulse 77, temperature 98 F (36.7 C), temperature source Axillary, resp. rate 16, height 5\' 3"  (1.6 m), weight 49.261 kg (108 lb 9.6 oz), SpO2 98 %.  PHYSICAL EXAMINATION:  GENERAL:  82 y.o.-year-old patient lying in the bed with no acute distress.  EYES: Pupils equal, round, reactive to light and accommodation. No scleral icterus. Extraocular muscles intact.  HEENT: Head  atraumatic, normocephalic. Oropharynx and nasopharynx clear.  NECK:  Supple, no jugular venous distention. No thyroid enlargement, no tenderness.  LUNGS: Normal breath sounds bilaterally, no wheezing, rales,rhonchi or crepitation. No use of accessory muscles of respiration.  CARDIOVASCULAR: S1, S2 normal. No murmurs, rubs, or gallops.  ABDOMEN: Soft, nontender, nondistended. Bowel sounds present. No organomegaly or mass.  EXTREMITIES: No pedal edema, cyanosis, or clubbing. Tenderness to calfs bilaterally. NEUROLOGIC: Cranial nerves II through XII are intact. Muscle strength 5/5 in all extremities. Sensation intact. Gait not checked.  PSYCHIATRIC: The patient is alert and oriented x 3.  SKIN: No obvious rash, lesion, or ulcer.    LABORATORY PANEL:   CBC  Recent Labs Lab 08/10/15 0442  WBC 12.0*  HGB 6.8*  HCT 21.8*  PLT 187   ------------------------------------------------------------------------------------------------------------------  Chemistries   Recent Labs Lab 08/08/15 1755 08/09/15 0830  NA 148*  --   K 4.1  --   CL 109  --   CO2 26  --   GLUCOSE 153*  --   BUN 46*  --   CREATININE 1.90* 1.52*  CALCIUM 8.8*  --    ------------------------------------------------------------------------------------------------------------------  Cardiac Enzymes No results for input(s): TROPONINI in the last 168 hours. ------------------------------------------------------------------------------------------------------------------  RADIOLOGY:  10/07/15 Venous Img Lower Unilateral Left  08/08/2015  CLINICAL DATA:  Left leg swelling and pain.  Recent traveling. EXAM: LEFT LOWER EXTREMITY VENOUS DOPPLER ULTRASOUND TECHNIQUE: Gray-scale sonography with graded compression, as well as color Doppler and duplex ultrasound, were performed to  evaluate the deep venous system from the level of the common femoral vein through the popliteal and proximal calf veins. Spectral Doppler was utilized to  evaluate flow at rest and with distal augmentation maneuvers. COMPARISON:  None. FINDINGS: There is echogenic thrombus in the right common femoral vein. Echogenic thrombus in the left common femoral vein does not compress. There is thrombus at the left saphenofemoral junction. Echogenic thrombus throughout the left femoral vein and left popliteal vein. Evidence for thrombus in the visualized left calf veins, including in the left posterior tibial veins. There is occlusive thrombus in the left profunda femoral vein. The thrombus in the left common femoral vein, left saphenofemoral junction, left femoral vein and left popliteal vein appears to be occlusive. IMPRESSION: Positive for bilateral lower extremity deep venous thrombosis. There is extensive occlusive thrombus throughout the left lower extremity extending from the calf to the left common femoral vein. Thrombus in the right common femoral vein. Electronically Signed   By: Richarda Overlie M.D.   On: 08/08/2015 17:12    EKG:   Orders placed or performed during the hospital encounter of 06/08/15  . ED EKG  . ED EKG  . EKG 12-Lead  . EKG 12-Lead  . EKG    ASSESSMENT AND PLAN:   #1 acute DVT of the lower legs bilaterally: Likely precipitant factor in long journey to [place]  And sedentary lifestyle. And recent immobilization; Echo Ef more than 55% .continue argatroban till INR is around 4.INR moe than 2,falsely elevated due to argatroban,so not safe to discharge yet,continue coumadin D./w son. 2. Rheumatoid arthritis: #3 hypertension ; patient to be continued on Norvasc, Toprol  #4 hypothyroidism; continue Synthyroid. History of CAD Dementia #4 ,anemia without evidence of GI was bleeding: Monitor hemoglobin closely. We will consider transfusion further workup  If hb  continues to drop.  All the records are reviewed and case discussed with Care Management/Social Workerr. Management plans discussed with the patient, family and they are in  agreement.  CODE STATUS: full TOTAL TIME TAKING CARE OF THIS PATIENT: .   POSSIBLE D/C IN 1-2  DAYS, DEPENDING ON CLINICAL CONDITION.   Katha Hamming M.D on 08/10/2015 at 12:25 PM  Between 7am to 6pm - Pager - [phone]  After 6pm go to [email or web address] - password [email or web address] [facility]  [place] [facility]  Office  [phone]  CC: Primary care physician; Novella Olive, NP   Note: This dictation was prepared with Dragon dictation along with smaller phrase technology. Any transcriptional errors that result from this process are unintentional.

## 2015-08-10 NOTE — Progress Notes (Signed)
ANTICOAGULATION CONSULT NOTE - Initial Consult  Pharmacy Consult for Coumadin Indication: DVT  Allergies  Allergen Reactions  . Caffeine Other (See Comments)    Reaction:  Unknown   . Codeine Other (See Comments)    Reaction:  Unknown   . Fish Oil Other (See Comments)    Reaction:  Unknown   . Librium [Chlordiazepoxide] Other (See Comments)    Reaction:  Unknown   . Penicillins Other (See Comments)    Reaction:  Unknown   . Shellfish Allergy Other (See Comments)    Reaction:  Unknown   . Latex Rash  . Minocycline Itching, Swelling and Rash    Patient Measurements: Height: 5\' 3"  (160 cm) Weight: 108 lb 9.6 oz (49.261 kg) IBW/kg (Calculated) : 52.4   Vital Signs: Temp: 97.5 F (36.4 C) (01/05 0510) Temp Source: Oral (01/05 0510) BP: 124/62 mmHg (01/05 0510) Pulse Rate: 66 (01/05 0510)  Labs:  Recent Labs  08/08/15 1755 08/08/15 2224  08/09/15 0443 08/09/15 0830 08/09/15 1208 08/09/15 1435 08/09/15 1846 08/10/15 0442  HGB 8.9* 7.7*  --  7.4*  --   --   --   --  6.8*  HCT 28.2* 24.4*  --  23.6*  --   --   --   --  21.8*  PLT 163 134*  --  150  --   --   --   --  187  APTT 37* 157*  < > 114* 85* 101* 87* 63* 79*  LABPROT 15.1*  --   --   --   --  32.3*  --   --  23.5*  INR 1.17  --   --   --   --  3.22  --   --  2.11  CREATININE 1.90*  --   --   --  1.52*  --   --   --   --   < > = values in this interval not displayed.  Estimated Creatinine Clearance: 19.5 mL/min (by C-G formula based on Cr of 1.52).   Medical History: Past Medical History  Diagnosis Date  . RA (rheumatoid arthritis) (HCC)   . Glaucoma     Followed by [facility]  . Hypertension   . Thyroid disease   . CAD (coronary artery disease)     Medications:  Scheduled:  . acetaminophen  1,000 mg Oral BID  . amLODipine  10 mg Oral Daily  . aspirin  81 mg Oral Daily  . atropine  1 drop Right Eye BID  . brimonidine  1 drop Right Eye BID   And  . timolol  1 drop Right Eye BID  .  cholecalciferol  2,000 Units Oral Daily  . enalapril  5 mg Oral Daily  . feeding supplement (ENSURE ENLIVE)  237 mL Oral TID WC  . latanoprost  1 drop Both Eyes QHS  . levothyroxine  75 mcg Oral QAC breakfast  . loratadine  10 mg Oral Daily  . memantine  28 mg Oral Daily  . metoprolol  50 mg Oral BID  . pravastatin  40 mg Oral QHS  . predniSONE  5 mg Oral QAC breakfast  . senna-docusate  2 tablet Oral BID  . sodium chloride  3 mL Intravenous Q12H  . warfarin  2.5 mg Oral q1800  . Warfarin - Pharmacist Dosing Inpatient   Does not apply q1800   Infusions:  . argatroban 1 mcg/kg/min (08/10/15 10/08/15)    Assessment: 82 y/o F with  B/L DVT on argatroban due to rxn to heparin to begin warfarin. Currently ordered Coumadin 2.5mg  daily.  1/5 INR 2.11  Goal of Therapy:  INR 2-3  However argatroban will falsely elevate INR. While patient is on argatroban will need to aim for an INR greater than 4.   Plan:  Will begin warfarin 2.5 mg daily. INR will be falsely elevated while on argatroban. Will need to follow argatroban dosing policy to guide therapy. INR ordered for AM.   Clovia Cuff, PharmD, BCPS 08/10/2015 9:54 AM

## 2015-08-10 NOTE — Clinical Documentation Improvement (Signed)
Internal Medicine  Abnormal Lab/Test Results:  Component     Latest Ref Rng 08/08/2015 08/09/2015  Creatinine     0.44 - 1.00 mg/dL 1.90 (H) 1.52 (H)  EGFR (African American)     >60 mL/min 26 (L) 34 (L)   Possible Clinical Conditions associated with below indicators  Acute renal failure  Acute Kidney Injurey  Acute on Chronic renal failure  Other Condition  Cannot Clinically Determine   Supporting Information: Risk factor:  82 year old black female History of Hypertension and CAD  Treatments Monitoring creatinine  Please exercise your independent, professional judgment when responding. A specific answer is not anticipated or expected.   Thank You,  Dilley [phone]

## 2015-08-10 NOTE — Care Management Important Message (Signed)
Important Message  Patient Details  Name: Bailey Lindsey MRN: [id number] Date of Birth: 03/14/1924   Medicare Important Message Given:  Yes    Olegario Messier A Aseem Sessums 08/10/2015, 11:52 AM

## 2015-08-10 NOTE — Plan of Care (Signed)
Problem: Education: Goal: Knowledge of [facility] Education information/materials will improve Outcome: Progressing Oriented to unit  Problem: Safety: Goal: Ability to remain free from injury will improve Outcome: Progressing High fall risk. Pt remains safe this shift.  Bed alarm in use.

## 2015-08-11 LAB — PROTIME-INR
INR: 2.53
INR: 3.05
PROTHROMBIN TIME: 26.9 s — AB (ref 11.4–15.0)
Prothrombin Time: 31 seconds — ABNORMAL HIGH (ref 11.4–15.0)

## 2015-08-11 LAB — CBC
HCT: 22.8 % — ABNORMAL LOW (ref 35.0–47.0)
Hemoglobin: 7.1 g/dL — ABNORMAL LOW (ref 12.0–16.0)
MCH: 27.4 pg (ref 26.0–34.0)
MCHC: 31.3 g/dL — ABNORMAL LOW (ref 32.0–36.0)
MCV: 87.5 fL (ref 80.0–100.0)
PLATELETS: 214 10*3/uL (ref 150–440)
RBC: 2.6 MIL/uL — ABNORMAL LOW (ref 3.80–5.20)
RDW: 18.2 % — AB (ref 11.5–14.5)
WBC: 12.9 10*3/uL — AB (ref 3.6–11.0)

## 2015-08-11 LAB — APTT: APTT: 91 s — AB (ref 24–36)

## 2015-08-11 NOTE — Progress Notes (Signed)
ANTICOAGULATION CONSULT NOTE - Initial Consult  Pharmacy Consult for Coumadin Indication: DVT  Allergies  Allergen Reactions  . Caffeine Other (See Comments)    Reaction:  Unknown   . Codeine Other (See Comments)    Reaction:  Unknown   . Fish Oil Other (See Comments)    Reaction:  Unknown   . Librium [Chlordiazepoxide] Other (See Comments)    Reaction:  Unknown   . Penicillins Other (See Comments)    Reaction:  Unknown   . Shellfish Allergy Other (See Comments)    Reaction:  Unknown   . Latex Rash  . Minocycline Itching, Swelling and Rash    Patient Measurements: Height: 5\' 3"  (160 cm) Weight: 108 lb 9.6 oz (49.261 kg) IBW/kg (Calculated) : 52.4   Vital Signs: Temp: 97.8 F (36.6 C) (01/06 0556) Temp Source: Oral (01/06 0556) BP: 121/64 mmHg (01/06 0556) Pulse Rate: 68 (01/06 0556)  Labs:  Recent Labs  08/08/15 1755  08/09/15 0443 08/09/15 0830 08/09/15 1208  08/09/15 1846 08/10/15 0442 08/11/15 0500  HGB 8.9*  < > 7.4*  --   --   --   --  6.8* 7.1*  HCT 28.2*  < > 23.6*  --   --   --   --  21.8* 22.8*  PLT 163  < > 150  --   --   --   --  187 214  APTT 37*  < > 114* 85* 101*  < > 63* 79* 91*  LABPROT 15.1*  --   --   --  32.3*  --   --  23.5* 26.9*  INR 1.17  --   --   --  3.22  --   --  2.11 2.53  CREATININE 1.90*  --   --  1.52*  --   --   --   --   --   < > = values in this interval not displayed.  Estimated Creatinine Clearance: 19.5 mL/min (by C-G formula based on Cr of 1.52).   Medical History: Past Medical History  Diagnosis Date  . RA (rheumatoid arthritis) (HCC)   . Glaucoma     Followed by [facility]  . Hypertension   . Thyroid disease   . CAD (coronary artery disease)     Medications:  Scheduled:  . acetaminophen  1,000 mg Oral BID  . amLODipine  10 mg Oral Daily  . atropine  1 drop Right Eye BID  . brimonidine  1 drop Right Eye BID   And  . timolol  1 drop Right Eye BID  . cholecalciferol  2,000 Units Oral Daily  .  enalapril  5 mg Oral Daily  . feeding supplement (ENSURE ENLIVE)  237 mL Oral TID WC  . latanoprost  1 drop Both Eyes QHS  . levothyroxine  75 mcg Oral QAC breakfast  . loratadine  10 mg Oral Daily  . memantine  28 mg Oral Daily  . metoprolol  50 mg Oral BID  . pravastatin  40 mg Oral QHS  . predniSONE  5 mg Oral QAC breakfast  . senna-docusate  2 tablet Oral BID  . sodium chloride  3 mL Intravenous Q12H  . warfarin  2.5 mg Oral q1800  . Warfarin - Pharmacist Dosing Inpatient   Does not apply q1800   Infusions:  . argatroban 1 mcg/kg/min (08/10/15 2243)    Assessment: 82 y/o F with B/L DVT on argatroban due to rxn to heparin to begin  warfarin. Currently ordered Coumadin 2.5mg  daily.  1/5 INR 2.11 1/6 INR 2.53  Goal of Therapy:  INR 2-3  However argatroban will falsely elevate INR. While patient is on argatroban will need to aim for an INR greater than 4.   Plan:  INR is increasing. Effects of dose are usually not seen for 3 days. Continue warfarin 2.5 mg daily. INR will be falsely elevated while on argatroban. Will need to follow argatroban dosing policy to guide therapy. INR ordered for AM.   Olene Floss, Pharm.D Clinical Pharmacist   08/11/2015 7:39 AM

## 2015-08-11 NOTE — Plan of Care (Signed)
Problem: Education: Goal: Knowledge of [facility] Education information/materials will improve Outcome: Progressing Has received General Education Handout. Oriented and Reoriented to Oncology unit.    Instructed and Demonstrated how to use phone to call RN and CNA for Assistance. Unable. Family remains at Bedside.  Problem: Safety: Goal: Ability to remain free from injury will improve Outcome: Progressing High fall risk with bed alarm activated. Instructed and Demonstrated how to use phone to call RN and CNA for Assistance. Unable. Remained free of injury of fall this shift. Family remains at bedside.     

## 2015-08-11 NOTE — Plan of Care (Signed)
Problem: Safety: Goal: Ability to remain free from injury will improve Outcome: Progressing No complaints of pain. Argatroban at 2.8. Next APTT this am around 0500.

## 2015-08-11 NOTE — Progress Notes (Signed)
[facility] - [place] at [facility]   PATIENT NAME: Bailey Lindsey    MR#:  [id number]  DATE OF BIRTH:  1924-05-04  SUBJECTIVE: Admitted for bilateral leg DVT. Marland Kitchen Unable to give hisotry as she is demented.on agatroban and coumadin.Marland Kitchen  CHIEF COMPLAINT:   Chief Complaint  Patient presents with  . Leg Pain    REVIEW OF SYSTEMS:   ROS CONSTITUTIONAL: No fever, fatigue or weakness.  EYES: No blurred or double vision.  EARS, NOSE, AND THROAT: No tinnitus or ear pain.  RESPIRATORY: No cough, shortness of breath, wheezing or hemoptysis.  CARDIOVASCULAR: No chest pain, orthopnea, edema.  GASTROINTESTINAL: No nausea, vomiting, diarrhea or abdominal pain.  GENITOURINARY: No dysuria, hematuria.  ENDOCRINE: No polyuria, nocturia,  HEMATOLOGY: No anemia, easy bruising or bleeding SKIN: No rash or lesion. MUSCULOSKELETAL: Bilateral leg the calf tenderness present  NEUROLOGIC: No tingling, numbness, weakness.  PSYCHIATRY: No anxiety or depression.   DRUG ALLERGIES:   Allergies  Allergen Reactions  . Caffeine Other (See Comments)    Reaction:  Unknown   . Codeine Other (See Comments)    Reaction:  Unknown   . Fish Oil Other (See Comments)    Reaction:  Unknown   . Librium [Chlordiazepoxide] Other (See Comments)    Reaction:  Unknown   . Penicillins Other (See Comments)    Reaction:  Unknown   . Shellfish Allergy Other (See Comments)    Reaction:  Unknown   . Latex Rash  . Minocycline Itching, Swelling and Rash    VITALS:  Blood pressure 159/64, pulse 79, temperature 97.6 F (36.4 C), temperature source Oral, resp. rate 20, height 5\' 3"  (1.6 m), weight 49.261 kg (108 lb 9.6 oz), SpO2 100 %.  PHYSICAL EXAMINATION:  GENERAL:  82 y.o.-year-old patient lying in the bed with no acute distress.  EYES: Pupils equal, round, reactive to light and accommodation. No scleral icterus. Extraocular muscles intact.  HEENT: Head atraumatic, normocephalic. Oropharynx and  nasopharynx clear.  NECK:  Supple, no jugular venous distention. No thyroid enlargement, no tenderness.  LUNGS: Normal breath sounds bilaterally, no wheezing, rales,rhonchi or crepitation. No use of accessory muscles of respiration.  CARDIOVASCULAR: S1, S2 normal. No murmurs, rubs, or gallops.  ABDOMEN: Soft, nontender, nondistended. Bowel sounds present. No organomegaly or mass.  EXTREMITIES: No pedal edema, cyanosis, or clubbing. Tenderness to calfs bilaterally. NEUROLOGIC: Cranial nerves II through XII are intact. Muscle strength 5/5 in all extremities. Sensation intact. Gait not checked.  PSYCHIATRIC: The patient is alert and oriented x 3.  SKIN: No obvious rash, lesion, or ulcer.    LABORATORY PANEL:   CBC  Recent Labs Lab 08/11/15 0500  WBC 12.9*  HGB 7.1*  HCT 22.8*  PLT 214   ------------------------------------------------------------------------------------------------------------------  Chemistries   Recent Labs Lab 08/08/15 1755 08/09/15 0830  NA 148*  --   K 4.1  --   CL 109  --   CO2 26  --   GLUCOSE 153*  --   BUN 46*  --   CREATININE 1.90* 1.52*  CALCIUM 8.8*  --    ------------------------------------------------------------------------------------------------------------------  Cardiac Enzymes No results for input(s): TROPONINI in the last 168 hours. ------------------------------------------------------------------------------------------------------------------  RADIOLOGY:  No results found.  EKG:   Orders placed or performed during the hospital encounter of 06/08/15  . ED EKG  . ED EKG  . EKG 12-Lead  . EKG 12-Lead  . EKG    ASSESSMENT AND PLAN:   #1 acute DVT of the lower  legs bilaterally: Likely precipitant factor in long journey to [place]  And sedentary lifestyle. And recent immobilization; Echo Ef more than 55% .continue argatroban till INR is around 4.INR moe than 2,falsely elevated due to argatroban,so not safe to discharge  yet,continue coumadin  Argatroban.oal INR more than 4,then d/ c argatroban and recheck INR ,if theruptic then d/c   Home  D./w son. 2. Rheumatoid arthritis: #3 hypertension ; patient to be continued on Norvasc, Toprol  #4 hypothyroidism; continue Synthyroid. History of CAD Dementia #4 ,anemia without evidence of GI was bleeding: Monitor hemoglobin closely. We will consider transfusion further workup  If hb  continues to drop.  All the records are reviewed and case discussed with Care Management/Social Workerr. Management plans discussed with the patient, family and they are in agreement.  CODE STATUS: full TOTAL TIME TAKING CARE OF THIS PATIENT: .   POSSIBLE D/C IN 1-2  DAYS, DEPENDING ON CLINICAL CONDITION.   Katha Hamming M.D on 08/11/2015 at 8:18 PM  Between 7am to 6pm - Pager - [phone]  After 6pm go to [email or web address] - password [email or web address] [facility]  [place] [facility]  Office  [phone]  CC: Primary care physician; Novella Olive, NP   Note: This dictation was prepared with Dragon dictation along with smaller phrase technology. Any transcriptional errors that result from this process are unintentional.

## 2015-08-11 NOTE — Progress Notes (Signed)
ANTICOAGULATION CONSULT NOTE - Initial Consult  Pharmacy Consult for argatroban Indication: DVT  Allergies  Allergen Reactions  . Caffeine Other (See Comments)    Reaction:  Unknown   . Codeine Other (See Comments)    Reaction:  Unknown   . Fish Oil Other (See Comments)    Reaction:  Unknown   . Librium [Chlordiazepoxide] Other (See Comments)    Reaction:  Unknown   . Penicillins Other (See Comments)    Reaction:  Unknown   . Shellfish Allergy Other (See Comments)    Reaction:  Unknown   . Latex Rash  . Minocycline Itching, Swelling and Rash    Patient Measurements: Height: 5\' 3"  (160 cm) Weight: 108 lb 9.6 oz (49.261 kg) IBW/kg (Calculated) : 52.4 Heparin Dosing Weight:   Vital Signs: Temp: 97.8 F (36.6 C) (01/06 0556) Temp Source: Oral (01/06 0556) BP: 121/64 mmHg (01/06 0556) Pulse Rate: 68 (01/06 0556)  Labs:  Recent Labs  08/08/15 1755  08/09/15 0443 08/09/15 0830 08/09/15 1208  08/09/15 1846 08/10/15 0442 08/11/15 0500  HGB 8.9*  < > 7.4*  --   --   --   --  6.8* 7.1*  HCT 28.2*  < > 23.6*  --   --   --   --  21.8* 22.8*  PLT 163  < > 150  --   --   --   --  187 214  APTT 37*  < > 114* 85* 101*  < > 63* 79* 91*  LABPROT 15.1*  --   --   --  32.3*  --   --  23.5* 26.9*  INR 1.17  --   --   --  3.22  --   --  2.11 2.53  CREATININE 1.90*  --   --  1.52*  --   --   --   --   --   < > = values in this interval not displayed.  Estimated Creatinine Clearance: 19.5 mL/min (by C-G formula based on Cr of 1.52).   Medical History: Past Medical History  Diagnosis Date  . RA (rheumatoid arthritis) (HCC)   . Glaucoma     [facility] by [facility]  . Hypertension   . Thyroid disease   . CAD (coronary artery disease)     Medications:  Infusions:  . argatroban 1 mcg/kg/min (08/10/15 2243)    Assessment: 89 yof with allergic reaction after receiving heparin. Starting argatroban for extensive DVTs. Currently at 1.23mcg/kg/min.   14:30 aPTT  resulted at 87  Goal of Therapy:  aPTT 50 to 90 seconds Monitor platelets by anticoagulation protocol: Yes   Plan:  APTT therapeutic. Will continue with current rate of 1 mcg/kg/min. Pharmacy will continue to monitor daily.   4m, Pharm.D., BCPS Clinical Pharmacist 08/11/2015,6:46 AM

## 2015-08-12 ENCOUNTER — Inpatient Hospital Stay: Payer: Medicare (Managed Care)

## 2015-08-12 LAB — CBC
HEMATOCRIT: 26.5 % — AB (ref 35.0–47.0)
Hemoglobin: 8.2 g/dL — ABNORMAL LOW (ref 12.0–16.0)
MCH: 27.1 pg (ref 26.0–34.0)
MCHC: 30.9 g/dL — ABNORMAL LOW (ref 32.0–36.0)
MCV: 87.6 fL (ref 80.0–100.0)
Platelets: 265 10*3/uL (ref 150–440)
RBC: 3.02 MIL/uL — AB (ref 3.80–5.20)
RDW: 17.9 % — ABNORMAL HIGH (ref 11.5–14.5)
WBC: 17 10*3/uL — AB (ref 3.6–11.0)

## 2015-08-12 LAB — PROTIME-INR
INR: 3.08
PROTHROMBIN TIME: 31.2 s — AB (ref 11.4–15.0)

## 2015-08-12 LAB — APTT: aPTT: 95 seconds — ABNORMAL HIGH (ref 24–36)

## 2015-08-12 NOTE — Progress Notes (Signed)
[facility] Physicians - [place] at [facility]   PATIENT NAME: Bailey Lindsey    MR#:  [id number]  DATE OF BIRTH:  12/22/1923  SUBJECTIVE: Admitted for bilateral leg DVT. Marland Kitchen Unable to give hisotry as she is demented.on agatroban and coumadin.. WBC up today,.  CHIEF COMPLAINT:   Chief Complaint  Patient presents with  . Leg Pain    REVIEW OF SYSTEMS:   Review of Systems  Unable to perform ROS: dementia      DRUG ALLERGIES:   Allergies  Allergen Reactions  . Caffeine Other (See Comments)    Reaction:  Unknown   . Codeine Other (See Comments)    Reaction:  Unknown   . Fish Oil Other (See Comments)    Reaction:  Unknown   . Librium [Chlordiazepoxide] Other (See Comments)    Reaction:  Unknown   . Penicillins Other (See Comments)    Reaction:  Unknown   . Shellfish Allergy Other (See Comments)    Reaction:  Unknown   . Latex Rash  . Minocycline Itching, Swelling and Rash    VITALS:  Blood pressure 152/68, pulse 79, temperature 98.2 F (36.8 C), temperature source Oral, resp. rate 15, height 5\' 3"  (1.6 m), weight 49.261 kg (108 lb 9.6 oz), SpO2 100 %.  PHYSICAL EXAMINATION:  GENERAL:  82 y.o.-year-old patient lying in the bed with no acute distress.  EYES: Pupils equal, round, reactive to light and accommodation. No scleral icterus. Extraocular muscles intact.  HEENT: Head atraumatic, normocephalic. Oropharynx and nasopharynx clear.  NECK:  Supple, no jugular venous distention. No thyroid enlargement, no tenderness.  LUNGS: Normal breath sounds bilaterally, no wheezing, rales,rhonchi or crepitation. No use of accessory muscles of respiration.  CARDIOVASCULAR: S1, S2 normal. No murmurs, rubs, or gallops.  ABDOMEN: Soft, nontender, nondistended. Bowel sounds present. No organomegaly or mass.  EXTREMITIES: No pedal edema, cyanosis, or clubbing. Tenderness to calfs bilaterally. NEUROLOGIC: Cranial nerves II through XII are intact. Muscle strength 5/5 in all  extremities. Sensation intact. Gait not checked.  PSYCHIATRIC: The patient is alert and oriented x 3.  SKIN: No obvious rash, lesion, or ulcer.    LABORATORY PANEL:   CBC  Recent Labs Lab 08/12/15 0723  WBC 17.0*  HGB 8.2*  HCT 26.5*  PLT 265   ------------------------------------------------------------------------------------------------------------------  Chemistries   Recent Labs Lab 08/08/15 1755 08/09/15 0830  NA 148*  --   K 4.1  --   CL 109  --   CO2 26  --   GLUCOSE 153*  --   BUN 46*  --   CREATININE 1.90* 1.52*  CALCIUM 8.8*  --    ------------------------------------------------------------------------------------------------------------------  Cardiac Enzymes No results for input(s): TROPONINI in the last 168 hours. ------------------------------------------------------------------------------------------------------------------  RADIOLOGY:  No results found.  EKG:   Orders placed or performed during the hospital encounter of 06/08/15  . ED EKG  . ED EKG  . EKG 12-Lead  . EKG 12-Lead  . EKG    ASSESSMENT AND PLAN:   #1 acute DVT of the lower legs bilaterally: Likely precipitant factor in [place]  And sedentary lifestyle. And recent immobilization; Echo Ef more than 55% . Continue Argatroban and coumadin.wait for INR to be 4 before stopping argatroban. D./w son. 2. Rheumatoid arthritis:on prednisone, #3 hypertension ; patient to be continued on Norvasc, Toprol  #4 hypothyroidism; continue Synthyroid. 5.History of CAD;continue statins.,bblockers 6.Dementia #7.,anemia without evidence of GI was bleeding: Monitor hemoglobin closely. We will consider transfusion further workup  If hb  continues to drop. 8.leucocytosis;check chest xray, leucocytosis likley due to prednisone,  All the records are reviewed and case discussed with Care Management/Social Workerr. Management plans discussed with the patient, family and they are  in agreement.  CODE STATUS: full TOTAL TIME TAKING CARE OF THIS PATIENT: .   POSSIBLE D/C IN 1-2  DAYS, DEPENDING ON CLINICAL CONDITION.   Katha Hamming M.D on 08/12/2015 at 12:22 PM  Between 7am to 6pm - Pager - [phone]  After 6pm go to [email or web address] - password [email or web address] [facility]  [place] [facility] [facility]  Office  [phone]  CC: Primary care physician; Novella Olive, NP   Note: This dictation was prepared with Dragon dictation along with smaller phrase technology. Any transcriptional errors that result from this process are unintentional.

## 2015-08-12 NOTE — Progress Notes (Signed)
ANTICOAGULATION CONSULT NOTE -FOLLOW UP   Pharmacy Consult for argatroban Indication: DVT  Allergies  Allergen Reactions  . Caffeine Other (See Comments)    Reaction:  Unknown   . Codeine Other (See Comments)    Reaction:  Unknown   . Fish Oil Other (See Comments)    Reaction:  Unknown   . Librium [Chlordiazepoxide] Other (See Comments)    Reaction:  Unknown   . Penicillins Other (See Comments)    Reaction:  Unknown   . Shellfish Allergy Other (See Comments)    Reaction:  Unknown   . Latex Rash  . Minocycline Itching, Swelling and Rash    Patient Measurements: Height: 5\' 3"  (160 cm) Weight: 108 lb 9.6 oz (49.261 kg) IBW/kg (Calculated) : 52.4 Heparin Dosing Weight:   Vital Signs: Temp: 98.2 F (36.8 C) (01/07 0912) Temp Source: Oral (01/07 0912) BP: 152/68 mmHg (01/07 0912) Pulse Rate: 79 (01/07 0912)  Labs:  Recent Labs  08/10/15 0442 08/11/15 0500 08/11/15 2122 08/12/15 0723  HGB 6.8* 7.1*  --  8.2*  HCT 21.8* 22.8*  --  26.5*  PLT 187 214  --  265  APTT 79* 91*  --  95*  LABPROT 23.5* 26.9* 31.0* 31.2*  INR 2.11 2.53 3.05 3.08    Estimated Creatinine Clearance: 19.5 mL/min (by C-G formula based on Cr of 1.52).   Medical History: Past Medical History  Diagnosis Date  . RA (rheumatoid arthritis) (HCC)   . Glaucoma     Followed by [facility]  . Hypertension   . Thyroid disease   . CAD (coronary artery disease)     Medications:  Infusions:  . argatroban 1 mcg/kg/min (08/12/15 0537)    Assessment: 89 yof with allergic reaction after receiving heparin. Starting argatroban for extensive DVTs. Currently at 1 mcg/kg/min.   Goal of Therapy:  aPTT 50 to 90 seconds Monitor platelets by anticoagulation protocol: Yes   Plan:  APTT therapeutic. Will continue with current rate of 1 mcg/kg/min. Pharmacy will continue to monitor daily.   Jaleiah Asay D, Pharm.D., BCPS Clinical Pharmacist 08/12/2015,9:19 AM

## 2015-08-12 NOTE — Progress Notes (Signed)
ANTICOAGULATION CONSULT NOTE - FOLLOW UP   Pharmacy Consult for Coumadin Indication: DVT  Allergies  Allergen Reactions  . Caffeine Other (See Comments)    Reaction:  Unknown   . Codeine Other (See Comments)    Reaction:  Unknown   . Fish Oil Other (See Comments)    Reaction:  Unknown   . Librium [Chlordiazepoxide] Other (See Comments)    Reaction:  Unknown   . Penicillins Other (See Comments)    Reaction:  Unknown   . Shellfish Allergy Other (See Comments)    Reaction:  Unknown   . Latex Rash  . Minocycline Itching, Swelling and Rash    Patient Measurements: Height: 5\' 3"  (160 cm) Weight: 108 lb 9.6 oz (49.261 kg) IBW/kg (Calculated) : 52.4   Vital Signs: Temp: 98.2 F (36.8 C) (01/07 0912) Temp Source: Oral (01/07 0912) BP: 152/68 mmHg (01/07 0912) Pulse Rate: 79 (01/07 0912)  Labs:  Recent Labs  08/10/15 0442 08/11/15 0500 08/11/15 2122 08/12/15 0723  HGB 6.8* 7.1*  --  8.2*  HCT 21.8* 22.8*  --  26.5*  PLT 187 214  --  265  APTT 79* 91*  --  95*  LABPROT 23.5* 26.9* 31.0* 31.2*  INR 2.11 2.53 3.05 3.08    Estimated Creatinine Clearance: 19.5 mL/min (by C-G formula based on Cr of 1.52).   Medical History: Past Medical History  Diagnosis Date  . RA (rheumatoid arthritis) (HCC)   . Glaucoma     Followed by [facility]  . Hypertension   . Thyroid disease   . CAD (coronary artery disease)     Medications:  Scheduled:  . acetaminophen  1,000 mg Oral BID  . amLODipine  10 mg Oral Daily  . atropine  1 drop Right Eye BID  . brimonidine  1 drop Right Eye BID   And  . timolol  1 drop Right Eye BID  . cholecalciferol  2,000 Units Oral Daily  . enalapril  5 mg Oral Daily  . feeding supplement (ENSURE ENLIVE)  237 mL Oral TID WC  . latanoprost  1 drop Both Eyes QHS  . levothyroxine  75 mcg Oral QAC breakfast  . loratadine  10 mg Oral Daily  . memantine  28 mg Oral Daily  . metoprolol  50 mg Oral BID  . pravastatin  40 mg Oral QHS  .  predniSONE  5 mg Oral QAC breakfast  . senna-docusate  2 tablet Oral BID  . sodium chloride  3 mL Intravenous Q12H  . warfarin  2.5 mg Oral q1800  . Warfarin - Pharmacist Dosing Inpatient   Does not apply q1800   Infusions:  . argatroban 1 mcg/kg/min (08/12/15 0537)    Assessment: 82 y/o F with B/L DVT on argatroban due to rxn to heparin to begin warfarin. Currently ordered Coumadin 2.5mg  daily.  1/5 INR 2.11 1/6 INR 2.53 1/7: INR: 3.08  Goal of Therapy:  INR 2-3  However argatroban will falsely elevate INR. While patient is on argatroban will need to aim for an INR greater than 4.   Plan:  INR is increasing. Effects of dose are usually not seen for 3 days. Continue warfarin 2.5 mg daily. INR will be falsely elevated while on argatroban. Will need to follow argatroban dosing policy to guide therapy. INR ordered for AM.   3/7, PharmD Clinical Pharmacist   08/12/2015 9:20 AM

## 2015-08-13 LAB — CBC
HCT: 23.6 % — ABNORMAL LOW (ref 35.0–47.0)
HEMOGLOBIN: 7.4 g/dL — AB (ref 12.0–16.0)
MCH: 27.5 pg (ref 26.0–34.0)
MCHC: 31.4 g/dL — ABNORMAL LOW (ref 32.0–36.0)
MCV: 87.6 fL (ref 80.0–100.0)
PLATELETS: 267 10*3/uL (ref 150–440)
RBC: 2.7 MIL/uL — AB (ref 3.80–5.20)
RDW: 17.9 % — ABNORMAL HIGH (ref 11.5–14.5)
WBC: 15.3 10*3/uL — AB (ref 3.6–11.0)

## 2015-08-13 LAB — APTT
APTT: 100 s — AB (ref 24–36)
APTT: 80 s — AB (ref 24–36)
APTT: 82 s — AB (ref 24–36)

## 2015-08-13 LAB — PROTIME-INR
INR: 3.25
PROTHROMBIN TIME: 32.5 s — AB (ref 11.4–15.0)

## 2015-08-13 MED ORDER — WARFARIN SODIUM 2 MG PO TABS
3.5000 mg | ORAL_TABLET | Freq: Every day | ORAL | Status: DC
  Administered 2015-08-13 – 2015-08-15 (×3): 3.5 mg via ORAL
  Filled 2015-08-13 (×2): qty 4
  Filled 2015-08-13 (×2): qty 1

## 2015-08-13 NOTE — Progress Notes (Signed)
ANTICOAGULATION CONSULT NOTE -FOLLOW UP   Pharmacy Consult for argatroban Indication: DVT  Allergies  Allergen Reactions  . Caffeine Other (See Comments)    Reaction:  Unknown   . Codeine Other (See Comments)    Reaction:  Unknown   . Fish Oil Other (See Comments)    Reaction:  Unknown   . Librium [Chlordiazepoxide] Other (See Comments)    Reaction:  Unknown   . Penicillins Other (See Comments)    Reaction:  Unknown   . Shellfish Allergy Other (See Comments)    Reaction:  Unknown   . Latex Rash  . Minocycline Itching, Swelling and Rash    Patient Measurements: Height: 5\' 3"  (160 cm) Weight: 108 lb 9.6 oz (49.261 kg) IBW/kg (Calculated) : 52.4 Heparin Dosing Weight:   Vital Signs: Temp: 97.5 F (36.4 C) (01/08 0534) Temp Source: Oral (01/08 0534) BP: 165/64 mmHg (01/08 0534) Pulse Rate: 70 (01/08 0534)  Labs:  Recent Labs  08/11/15 0500 08/11/15 2122 08/12/15 0723 08/13/15 0444 08/13/15 1008  HGB 7.1*  --  8.2* 7.4*  --   HCT 22.8*  --  26.5* 23.6*  --   PLT 214  --  265 267  --   APTT 91*  --  95* 100* 80*  LABPROT 26.9* 31.0* 31.2* 32.5*  --   INR 2.53 3.05 3.08 3.25  --     Estimated Creatinine Clearance: 19.5 mL/min (by C-G formula based on Cr of 1.52).   Medical History: Past Medical History  Diagnosis Date  . RA (rheumatoid arthritis) (HCC)   . Glaucoma     Followed by [facility]  . Hypertension   . Thyroid disease   . CAD (coronary artery disease)     Medications:  Infusions:  . argatroban 0.9 mcg/kg/min (08/13/15 10/11/15)    Assessment: 89 yof with allergic reaction after receiving heparin. Starting argatroban for extensive DVTs. Currently at 1 mcg/kg/min.   Goal of Therapy:  aPTT 50 to 90 seconds Monitor platelets by anticoagulation protocol: Yes   Plan:  APTT within goal range @ 80. Will continue current rate of 0.9 mcg/kg/min. Will recheck in 3 hours.   Edwardo Wojnarowski D, Pharm.D., BCPS Clinical  Pharmacist 08/13/2015,10:47 AM

## 2015-08-13 NOTE — Progress Notes (Signed)
[facility] - [place] at [facility]   PATIENT NAME: Bailey Lindsey    MR#:  [id number]  DATE OF BIRTH:  07-08-1924  SUBJECTIVE: Admitted for bilateral leg DVT. Marland Kitchen Unable to give hisotry as she is demented.on agatroban and coumadin.. INR today 3.25.  CHIEF COMPLAINT:   Chief Complaint  Patient presents with  . Leg Pain    REVIEW OF SYSTEMS:   Review of Systems  Unable to perform ROS: dementia      DRUG ALLERGIES:   Allergies  Allergen Reactions  . Caffeine Other (See Comments)    Reaction:  Unknown   . Codeine Other (See Comments)    Reaction:  Unknown   . Fish Oil Other (See Comments)    Reaction:  Unknown   . Librium [Chlordiazepoxide] Other (See Comments)    Reaction:  Unknown   . Penicillins Other (See Comments)    Reaction:  Unknown   . Shellfish Allergy Other (See Comments)    Reaction:  Unknown   . Latex Rash  . Minocycline Itching, Swelling and Rash    VITALS:  Blood pressure 165/64, pulse 70, temperature 97.5 F (36.4 C), temperature source Oral, resp. rate 16, height 5\' 3"  (1.6 m), weight 49.261 kg (108 lb 9.6 oz), SpO2 100 %.  PHYSICAL EXAMINATION:  GENERAL:  82 y.o.-year-old patient lying in the bed with no acute distress.  EYES: Pupils equal, round, reactive to light and accommodation. No scleral icterus. Extraocular muscles intact.  HEENT: Head atraumatic, normocephalic. Oropharynx and nasopharynx clear.  NECK:  Supple, no jugular venous distention. No thyroid enlargement, no tenderness.  LUNGS: Normal breath sounds bilaterally, no wheezing, rales,rhonchi or crepitation. No use of accessory muscles of respiration.  CARDIOVASCULAR: S1, S2 normal. No murmurs, rubs, or gallops.  ABDOMEN: Soft, nontender, nondistended. Bowel sounds present. No organomegaly or mass.  EXTREMITIES: No pedal edema, cyanosis, or clubbing. Tenderness to calfs bilaterally. NEUROLOGIC: Cranial nerves II through XII are intact. Muscle strength 5/5 in all  extremities. Sensation intact. Gait not checked.  PSYCHIATRIC:  Episodes of  Confusion due to dementia,, SKIN: No obvious rash, lesion, or ulcer.    LABORATORY PANEL:   CBC  Recent Labs Lab 08/13/15 0444  WBC 15.3*  HGB 7.4*  HCT 23.6*  PLT 267   ------------------------------------------------------------------------------------------------------------------  Chemistries   Recent Labs Lab 08/08/15 1755 08/09/15 0830  NA 148*  --   K 4.1  --   CL 109  --   CO2 26  --   GLUCOSE 153*  --   BUN 46*  --   CREATININE 1.90* 1.52*  CALCIUM 8.8*  --    ------------------------------------------------------------------------------------------------------------------  Cardiac Enzymes No results for input(s): TROPONINI in the last 168 hours. ------------------------------------------------------------------------------------------------------------------  RADIOLOGY:  Dg Chest 1 View  08/12/2015  CLINICAL DATA:  Lethargy. EXAM: CHEST 1 VIEW COMPARISON:  Chest radiograph 06/08/2015. FINDINGS: Patient status post median sternotomy. Stable enlarged cardiac and mediastinal contours. Low lung volumes. No large area of pulmonary consolidation. Left basilar atelectasis and or scarring. Calcified granulomas right lung. No pleural effusion or pneumothorax. Old proximal left humerus fracture. IMPRESSION: No acute cardiopulmonary process. Electronically Signed   By: 13/10/2014 M.D.   On: 08/12/2015 12:55    EKG:   Orders placed or performed during the hospital encounter of 06/08/15  . ED EKG  . ED EKG  . EKG 12-Lead  . EKG 12-Lead  . EKG    ASSESSMENT AND PLAN:   #1 acute DVT of the  lower legs bilaterally: Likely precipitant factor in long journey to [place]  And sedentary lifestyle. And recent immobilization; Echo Ef more than 55% . Continue Argatroban and coumadin.wait for INR to be 4 before stopping argatroban. D./w son. 2. Rheumatoid arthritis:on prednisone, #3  hypertension ; patient to be continued on Norvasc, Toprol  #4 hypothyroidism; continue Synthyroid. 5.History of CAD;continue statins.,bblockers 6.Dementia #7.,anemia without evidence of GI was bleeding: Monitor hemoglobin closely. We will consider transfusion further workup  If hb  continues to drop. 8.leucocytosis;due to prednisone. Chest x-ray is clear. #9 /Acute renal failure due to ATN from DVT;improving,check BMP am.hold ACE.normal kidney function in sep /2016.but pt GFR fluctuates between 48 to 60.  All the records are reviewed and case discussed with Care Management/Social Workerr. Management plans discussed with the patient, family and they are in agreement.  CODE STATUS: full TOTAL TIME TAKING CARE OF THIS PATIENT: .   POSSIBLE D/C IN 1-2  DAYS, DEPENDING ON CLINICAL CONDITION.   Katha Hamming M.D on 08/13/2015 at 9:09 AM  Between 7am to 6pm - Pager - [phone]  After 6pm go to [email or web address] - password [email or web address] [facility]  [facility]  Office  [phone]  CC: Primary care physician; Novella Olive, NP   Note: This dictation was prepared with Dragon dictation along with smaller phrase technology. Any transcriptional errors that result from this process are unintentional.

## 2015-08-13 NOTE — Progress Notes (Signed)
ANTICOAGULATION CONSULT NOTE -FOLLOW UP   Pharmacy Consult for argatroban Indication: DVT  Allergies  Allergen Reactions  . Caffeine Other (See Comments)    Reaction:  Unknown   . Codeine Other (See Comments)    Reaction:  Unknown   . Fish Oil Other (See Comments)    Reaction:  Unknown   . Librium [Chlordiazepoxide] Other (See Comments)    Reaction:  Unknown   . Penicillins Other (See Comments)    Reaction:  Unknown   . Shellfish Allergy Other (See Comments)    Reaction:  Unknown   . Latex Rash  . Minocycline Itching, Swelling and Rash    Patient Measurements: Height: 5\' 3"  (160 cm) Weight: 108 lb 9.6 oz (49.261 kg) IBW/kg (Calculated) : 52.4 Heparin Dosing Weight:   Vital Signs: Temp: 97.5 F (36.4 C) (01/08 0534) Temp Source: Oral (01/08 0534) BP: 165/64 mmHg (01/08 0534) Pulse Rate: 70 (01/08 0534)  Labs:  Recent Labs  08/11/15 0500 08/11/15 2122 08/12/15 0723 08/13/15 0444  HGB 7.1*  --  8.2* 7.4*  HCT 22.8*  --  26.5* 23.6*  PLT 214  --  265 267  APTT 91*  --  95* 100*  LABPROT 26.9* 31.0* 31.2* 32.5*  INR 2.53 3.05 3.08 3.25    Estimated Creatinine Clearance: 19.5 mL/min (by C-G formula based on Cr of 1.52).   Medical History: Past Medical History  Diagnosis Date  . RA (rheumatoid arthritis) (HCC)   . Glaucoma     Followed by [facility]  . Hypertension   . Thyroid disease   . CAD (coronary artery disease)     Medications:  Infusions:  . argatroban 1 mcg/kg/min (08/13/15 0236)    Assessment: 89 yof with allergic reaction after receiving heparin. Starting argatroban for extensive DVTs. Currently at 1 mcg/kg/min.   Goal of Therapy:  aPTT 50 to 90 seconds Monitor platelets by anticoagulation protocol: Yes   Plan:  APPT supratherapeutic, decreased rate to 0.9 mcg/kg/min. Will recheck in 3 hours.   10/11/15, Pharm.D., BCPS Clinical Pharmacist 08/13/2015,6:52 AM

## 2015-08-13 NOTE — Progress Notes (Signed)
ANTICOAGULATION CONSULT NOTE - FOLLOW UP   Pharmacy Consult for Coumadin Indication: DVT  Allergies  Allergen Reactions  . Caffeine Other (See Comments)    Reaction:  Unknown   . Codeine Other (See Comments)    Reaction:  Unknown   . Fish Oil Other (See Comments)    Reaction:  Unknown   . Librium [Chlordiazepoxide] Other (See Comments)    Reaction:  Unknown   . Penicillins Other (See Comments)    Reaction:  Unknown   . Shellfish Allergy Other (See Comments)    Reaction:  Unknown   . Latex Rash  . Minocycline Itching, Swelling and Rash    Patient Measurements: Height: 5\' 3"  (160 cm) Weight: 108 lb 9.6 oz (49.261 kg) IBW/kg (Calculated) : 52.4   Vital Signs: Temp: 97.5 F (36.4 C) (01/08 0534) Temp Source: Oral (01/08 0534) BP: 165/64 mmHg (01/08 0534) Pulse Rate: 70 (01/08 0534)  Labs:  Recent Labs  08/11/15 0500 08/11/15 2122 08/12/15 0723 08/13/15 0444  HGB 7.1*  --  8.2* 7.4*  HCT 22.8*  --  26.5* 23.6*  PLT 214  --  265 267  APTT 91*  --  95* 100*  LABPROT 26.9* 31.0* 31.2* 32.5*  INR 2.53 3.05 3.08 3.25    Estimated Creatinine Clearance: 19.5 mL/min (by C-G formula based on Cr of 1.52).   Medical History: Past Medical History  Diagnosis Date  . RA (rheumatoid arthritis) (HCC)   . Glaucoma     Followed by [facility]  . Hypertension   . Thyroid disease   . CAD (coronary artery disease)     Medications:  Scheduled:  . acetaminophen  1,000 mg Oral BID  . amLODipine  10 mg Oral Daily  . atropine  1 drop Right Eye BID  . brimonidine  1 drop Right Eye BID   And  . timolol  1 drop Right Eye BID  . cholecalciferol  2,000 Units Oral Daily  . enalapril  5 mg Oral Daily  . feeding supplement (ENSURE ENLIVE)  237 mL Oral TID WC  . latanoprost  1 drop Both Eyes QHS  . levothyroxine  75 mcg Oral QAC breakfast  . loratadine  10 mg Oral Daily  . memantine  28 mg Oral Daily  . metoprolol  50 mg Oral BID  . pravastatin  40 mg Oral QHS  .  predniSONE  5 mg Oral QAC breakfast  . senna-docusate  2 tablet Oral BID  . sodium chloride  3 mL Intravenous Q12H  . warfarin  3.5 mg Oral q1800  . Warfarin - Pharmacist Dosing Inpatient   Does not apply q1800   Infusions:  . argatroban 0.9 mcg/kg/min (08/13/15 10/11/15)    Assessment: 82 y/o F with B/L DVT on argatroban due to rxn to heparin to begin warfarin. Currently ordered Coumadin 2.5mg  daily.  1/5 INR 2.11 1/6 INR 2.53 1/7: INR: 3.08 1/8: INR: 3.25   Goal of Therapy:  INR 2-3  However argatroban will falsely elevate INR. While patient is on argatroban will need to aim for an INR greater than 4.   Plan:  INR is increasing. Effects of dose are usually not seen for 3 days. Will increase warfarin from 2.5 to 3.5 mg daily. INR will be falsely elevated while on argatroban. Will need to follow argatroban dosing policy to guide therapy. INR ordered for AM.   3/8, PharmD Clinical Pharmacist   08/13/2015 8:25 AM

## 2015-08-13 NOTE — Progress Notes (Signed)
ANTICOAGULATION CONSULT NOTE -FOLLOW UP   Pharmacy Consult for argatroban Indication: DVT  Allergies  Allergen Reactions  . Caffeine Other (See Comments)    Reaction:  Unknown   . Codeine Other (See Comments)    Reaction:  Unknown   . Fish Oil Other (See Comments)    Reaction:  Unknown   . Librium [Chlordiazepoxide] Other (See Comments)    Reaction:  Unknown   . Penicillins Other (See Comments)    Reaction:  Unknown   . Shellfish Allergy Other (See Comments)    Reaction:  Unknown   . Latex Rash  . Minocycline Itching, Swelling and Rash    Patient Measurements: Height: 5\' 3"  (160 cm) Weight: 108 lb 9.6 oz (49.261 kg) IBW/kg (Calculated) : 52.4 Heparin Dosing Weight:   Vital Signs: Temp: 98.6 F (37 C) (01/08 1355) Temp Source: Oral (01/08 0534) BP: 151/71 mmHg (01/08 1355) Pulse Rate: 77 (01/08 1355)  Labs:  Recent Labs  08/11/15 0500 08/11/15 2122 08/12/15 0723 08/13/15 0444 08/13/15 1008 08/13/15 1327  HGB 7.1*  --  8.2* 7.4*  --   --   HCT 22.8*  --  26.5* 23.6*  --   --   PLT 214  --  265 267  --   --   APTT 91*  --  95* 100* 80* 82*  LABPROT 26.9* 31.0* 31.2* 32.5*  --   --   INR 2.53 3.05 3.08 3.25  --   --     Estimated Creatinine Clearance: 19.5 mL/min (by C-G formula based on Cr of 1.52).   Medical History: Past Medical History  Diagnosis Date  . RA (rheumatoid arthritis) (HCC)   . Glaucoma     Followed by [facility]  . Hypertension   . Thyroid disease   . CAD (coronary artery disease)     Medications:  Infusions:  . argatroban 0.9 mcg/kg/min (08/13/15 10/11/15)    Assessment: 89 yof with allergic reaction after receiving heparin. Starting argatroban for extensive DVTs. Currently at 9 mcg/kg/min  Goal of Therapy:  aPTT 50 to 90 seconds Monitor platelets by anticoagulation protocol: Yes   Plan:  APTT within goal range @ 80. Will continue current rate of 0.9 mcg/kg/min. Will recheck in 3 hours.  1/8: Repeat APTT = 82, which  is within goal range. Will check next APTT with am labs. Pharmacy to follow.    Maida Widger D, Pharm.D., BCPS Clinical Pharmacist 08/13/2015,2:00 PM

## 2015-08-14 LAB — BASIC METABOLIC PANEL
ANION GAP: 8 (ref 5–15)
BUN: 40 mg/dL — ABNORMAL HIGH (ref 6–20)
CHLORIDE: 117 mmol/L — AB (ref 101–111)
CO2: 26 mmol/L (ref 22–32)
CREATININE: 1.07 mg/dL — AB (ref 0.44–1.00)
Calcium: 8.6 mg/dL — ABNORMAL LOW (ref 8.9–10.3)
GFR calc non Af Amer: 45 mL/min — ABNORMAL LOW (ref >60)
GFR, EST AFRICAN AMERICAN: 52 mL/min — AB (ref >60)
Glucose, Bld: 119 mg/dL — ABNORMAL HIGH (ref 65–99)
POTASSIUM: 4.3 mmol/L (ref 3.5–5.1)
SODIUM: 151 mmol/L — AB (ref 135–145)

## 2015-08-14 LAB — CBC
HEMATOCRIT: 23 % — AB (ref 35.0–47.0)
HEMOGLOBIN: 7.3 g/dL — AB (ref 12.0–16.0)
MCH: 27.5 pg (ref 26.0–34.0)
MCHC: 31.6 g/dL — ABNORMAL LOW (ref 32.0–36.0)
MCV: 87 fL (ref 80.0–100.0)
Platelets: 253 10*3/uL (ref 150–440)
RBC: 2.64 MIL/uL — AB (ref 3.80–5.20)
RDW: 18.3 % — AB (ref 11.5–14.5)
WBC: 15.5 10*3/uL — AB (ref 3.6–11.0)

## 2015-08-14 LAB — PROTIME-INR
INR: 3.78
Prothrombin Time: 36.4 seconds — ABNORMAL HIGH (ref 11.4–15.0)

## 2015-08-14 LAB — APTT
APTT: 97 s — AB (ref 24–36)
APTT: 96 s — AB (ref 24–36)
APTT: 82 s — AB (ref 24–36)
APTT: 75 s — AB (ref 24–36)
aPTT: 87 seconds — ABNORMAL HIGH (ref 24–36)

## 2015-08-14 MED ORDER — DEXTROSE 5 % IV SOLN
INTRAVENOUS | Status: AC
  Administered 2015-08-14 – 2015-08-15 (×2): via INTRAVENOUS

## 2015-08-14 NOTE — Plan of Care (Signed)
Problem: Education: Goal: Knowledge of [facility] Education information/materials will improve Outcome: Not Progressing confused  Problem: Safety: Goal: Ability to remain free from injury will improve Outcome: Progressing No injury this shift

## 2015-08-14 NOTE — Progress Notes (Signed)
ANTICOAGULATION CONSULT NOTE - FOLLOW UP   Pharmacy Consult for Coumadin Indication: DVT  Allergies  Allergen Reactions  . Caffeine Other (See Comments)    Reaction:  Unknown   . Codeine Other (See Comments)    Reaction:  Unknown   . Fish Oil Other (See Comments)    Reaction:  Unknown   . Librium [Chlordiazepoxide] Other (See Comments)    Reaction:  Unknown   . Penicillins Other (See Comments)    Reaction:  Unknown   . Shellfish Allergy Other (See Comments)    Reaction:  Unknown   . Latex Rash  . Minocycline Itching, Swelling and Rash    Patient Measurements: Height: 5\' 3"  (160 cm) Weight: 108 lb 9.6 oz (49.261 kg) IBW/kg (Calculated) : 52.4   Vital Signs: Temp: 97.5 F (36.4 C) (01/09 0429) Temp Source: Oral (01/09 0429) BP: 157/69 mmHg (01/09 0429) Pulse Rate: 71 (01/09 0429)  Labs:  Recent Labs  08/12/15 0723 08/13/15 0444 08/13/15 1008 08/13/15 1327 08/14/15 0524  HGB 8.2* 7.4*  --   --  7.3*  HCT 26.5* 23.6*  --   --  23.0*  PLT 265 267  --   --  253  APTT 95* 100* 80* 82* 97*  LABPROT 31.2* 32.5*  --   --  36.4*  INR 3.08 3.25  --   --  3.78  CREATININE  --   --   --   --  1.07*    Estimated Creatinine Clearance: 27.7 mL/min (by C-G formula based on Cr of 1.07).   Medical History: Past Medical History  Diagnosis Date  . RA (rheumatoid arthritis) (HCC)   . Glaucoma     Followed by [facility]  . Hypertension   . Thyroid disease   . CAD (coronary artery disease)     Medications:  Scheduled:  . acetaminophen  1,000 mg Oral BID  . amLODipine  10 mg Oral Daily  . atropine  1 drop Right Eye BID  . brimonidine  1 drop Right Eye BID   And  . timolol  1 drop Right Eye BID  . cholecalciferol  2,000 Units Oral Daily  . feeding supplement (ENSURE ENLIVE)  237 mL Oral TID WC  . latanoprost  1 drop Both Eyes QHS  . levothyroxine  75 mcg Oral QAC breakfast  . loratadine  10 mg Oral Daily  . memantine  28 mg Oral Daily  . metoprolol  50  mg Oral BID  . pravastatin  40 mg Oral QHS  . predniSONE  5 mg Oral QAC breakfast  . senna-docusate  2 tablet Oral BID  . sodium chloride  3 mL Intravenous Q12H  . warfarin  3.5 mg Oral q1800  . Warfarin - Pharmacist Dosing Inpatient   Does not apply q1800   Infusions:  . argatroban 0.8 mcg/kg/min (08/14/15 10/12/15)    Assessment: 82 y/o F with B/L DVT on argatroban due to rxn to heparin to begin warfarin. Currently ordered Coumadin 2.5mg  daily.  1/5 INR 2.11 1/6 INR 2.53 1/7: INR: 3.08 1/8: INR: 3.25  1/9: INR: 3.78  Goal of Therapy:  INR 2-3  However argatroban will falsely elevate INR. While patient is on argatroban will need to aim for an INR greater than 4.   Plan:  INR is increasing. Effects of dose are usually not seen for 3 days. Will continue with warfarin 3.5 mg daily. INR will be falsely elevated while on argatroban. Will need to follow argatroban  dosing policy to guide therapy. INR ordered for AM.   Clovia Cuff, PharmD, BCPS 08/14/2015 9:56 AM

## 2015-08-14 NOTE — Progress Notes (Signed)
ANTICOAGULATION CONSULT NOTE -FOLLOW UP   Pharmacy Consult for argatroban Indication: DVT  Allergies  Allergen Reactions  . Caffeine Other (See Comments)    Reaction:  Unknown   . Codeine Other (See Comments)    Reaction:  Unknown   . Fish Oil Other (See Comments)    Reaction:  Unknown   . Librium [Chlordiazepoxide] Other (See Comments)    Reaction:  Unknown   . Penicillins Other (See Comments)    Reaction:  Unknown   . Shellfish Allergy Other (See Comments)    Reaction:  Unknown   . Latex Rash  . Minocycline Itching, Swelling and Rash    Patient Measurements: Height: 5\' 3"  (160 cm) Weight: 108 lb 9.6 oz (49.261 kg) IBW/kg (Calculated) : 52.4 Heparin Dosing Weight:   Vital Signs: Temp: 97.5 F (36.4 C) (01/09 0429) Temp Source: Oral (01/09 0429) BP: 157/69 mmHg (01/09 0429) Pulse Rate: 71 (01/09 0429)  Labs:  Recent Labs  08/12/15 0723 08/13/15 0444 08/13/15 1008 08/13/15 1327 08/14/15 0524  HGB 8.2* 7.4*  --   --  7.3*  HCT 26.5* 23.6*  --   --  23.0*  PLT 265 267  --   --  253  APTT 95* 100* 80* 82* 97*  LABPROT 31.2* 32.5*  --   --  36.4*  INR 3.08 3.25  --   --  3.78  CREATININE  --   --   --   --  1.07*    Estimated Creatinine Clearance: 27.7 mL/min (by C-G formula based on Cr of 1.07).   Medical History: Past Medical History  Diagnosis Date  . RA (rheumatoid arthritis) (HCC)   . Glaucoma     Followed by [facility]  . Hypertension   . Thyroid disease   . CAD (coronary artery disease)     Medications:  Infusions:  . argatroban 0.9 mcg/kg/min (08/13/15 2122)    Assessment: 89 yof with allergic reaction after receiving heparin. Starting argatroban for extensive DVTs. Currently at 9 mcg/kg/min  Goal of Therapy:  aPTT 50 to 90 seconds Monitor platelets by anticoagulation protocol: Yes   Plan:  APTT supratherapeutic. Decrease rate to 0.8 mcg/kg/min, will recheck in 3 hours.   2123, Pharm.D., BCPS Clinical  Pharmacist 08/14/2015,7:06 AM

## 2015-08-14 NOTE — Progress Notes (Signed)
ANTICOAGULATION CONSULT NOTE -FOLLOW UP   Pharmacy Consult for argatroban Indication: DVT  Allergies  Allergen Reactions  . Caffeine Other (See Comments)    Reaction:  Unknown   . Codeine Other (See Comments)    Reaction:  Unknown   . Fish Oil Other (See Comments)    Reaction:  Unknown   . Librium [Chlordiazepoxide] Other (See Comments)    Reaction:  Unknown   . Penicillins Other (See Comments)    Reaction:  Unknown   . Shellfish Allergy Other (See Comments)    Reaction:  Unknown   . Latex Rash  . Minocycline Itching, Swelling and Rash    Patient Measurements: Height: 5\' 3"  (160 cm) Weight: 108 lb 9.6 oz (49.261 kg) IBW/kg (Calculated) : 52.4 Heparin Dosing Weight:   Vital Signs: Temp: 98.7 F (37.1 C) (01/09 1322) Temp Source: Oral (01/09 0429) BP: 160/82 mmHg (01/09 1322) Pulse Rate: 85 (01/09 1322)  Labs:  Recent Labs  08/12/15 0723 08/13/15 0444  08/14/15 0524 08/14/15 1045 08/14/15 1404  HGB 8.2* 7.4*  --  7.3*  --   --   HCT 26.5* 23.6*  --  23.0*  --   --   PLT 265 267  --  253  --   --   APTT 95* 100*  < > 97* 87* 96*  LABPROT 31.2* 32.5*  --  36.4*  --   --   INR 3.08 3.25  --  3.78  --   --   CREATININE  --   --   --  1.07*  --   --   < > = values in this interval not displayed.  Estimated Creatinine Clearance: 27.7 mL/min (by C-G formula based on Cr of 1.07).   Medical History: Past Medical History  Diagnosis Date  . RA (rheumatoid arthritis) (HCC)   . Glaucoma     Followed by [facility]  . Hypertension   . Thyroid disease   . CAD (coronary artery disease)     Medications:  Infusions:  . argatroban 0.8 mcg/kg/min (08/14/15 0709)  . dextrose      Assessment: 79 yof with allergic reaction after receiving heparin. Starting argatroban for extensive DVTs. Currently at 0.8 mcg/kg/min.  1/8 10:30 aPTT therapeutic at 87 1/8 14:00 aPTT supratherapeutic at 96  Goal of Therapy:  Goal aPTT 45 to 95 seconds Monitor platelets by  anticoagulation protocol: Yes   Plan:  APTT supratherapeutic. Decrease rate to 0.5 mcg/kg/min, will recheck in 3 hours.   92, Pharm.D., BCPS Clinical Pharmacist 08/14/2015,2:58 PM

## 2015-08-14 NOTE — Progress Notes (Signed)
ANTICOAGULATION CONSULT NOTE -FOLLOW UP   Pharmacy Consult for argatroban Indication: DVT  Allergies  Allergen Reactions  . Caffeine Other (See Comments)    Reaction:  Unknown   . Codeine Other (See Comments)    Reaction:  Unknown   . Fish Oil Other (See Comments)    Reaction:  Unknown   . Librium [Chlordiazepoxide] Other (See Comments)    Reaction:  Unknown   . Penicillins Other (See Comments)    Reaction:  Unknown   . Shellfish Allergy Other (See Comments)    Reaction:  Unknown   . Latex Rash  . Minocycline Itching, Swelling and Rash    Patient Measurements: Height: 5\' 3"  (160 cm) Weight: 108 lb 9.6 oz (49.261 kg) IBW/kg (Calculated) : 52.4 Heparin Dosing Weight:   Vital Signs: Temp: 98.7 F (37.1 C) (01/09 1322) BP: 160/82 mmHg (01/09 1322) Pulse Rate: 85 (01/09 1322)  Labs:  Recent Labs  08/12/15 0723 08/13/15 0444  08/14/15 0524 08/14/15 1045 08/14/15 1404 08/14/15 1820  HGB 8.2* 7.4*  --  7.3*  --   --   --   HCT 26.5* 23.6*  --  23.0*  --   --   --   PLT 265 267  --  253  --   --   --   APTT 95* 100*  < > 97* 87* 96* 82*  LABPROT 31.2* 32.5*  --  36.4*  --   --   --   INR 3.08 3.25  --  3.78  --   --   --   CREATININE  --   --   --  1.07*  --   --   --   < > = values in this interval not displayed.  Estimated Creatinine Clearance: 27.7 mL/min (by C-G formula based on Cr of 1.07).   Medical History: Past Medical History  Diagnosis Date  . RA (rheumatoid arthritis) (HCC)   . Glaucoma     Followed by [facility]  . Hypertension   . Thyroid disease   . CAD (coronary artery disease)     Medications:  Infusions:  . argatroban 0.5 mcg/kg/min (08/14/15 1506)  . dextrose 50 mL/hr at 08/14/15 1505    Assessment: 89 yof with allergic reaction after receiving heparin. Starting argatroban for extensive DVTs. Currently at 0.8 mcg/kg/min.   1/9 14:00 aPTT supratherapeutic at 96 1/9 1820 APTT therapeutic at 82  Goal of Therapy:  Goal  aPTT 45 to 95 seconds Monitor platelets by anticoagulation protocol: Yes   Plan:  APTT therapeutic. Continue rate at 0.5 mcg/kg/min, will recheck in 3 hours for a second confirmation level   Raeven Pint D Miniya Miguez, Pharm.D. Clinical Pharmacist 08/14/2015,7:05 PM

## 2015-08-14 NOTE — Progress Notes (Signed)
[facility] - [place] at [facility]   PATIENT NAME: Bailey Lindsey    MR#:  [id number]  DATE OF BIRTH:  1924/06/09  SUBJECTIVE: Admitted for bilateral leg DVT. Marland Kitchen Unable to give hisotry as she is demented.on agatroban and coumadin.no new complaints.slightly more lethargic.  CHIEF COMPLAINT:   Chief Complaint  Patient presents with  . Leg Pain    REVIEW OF SYSTEMS:   Review of Systems  Unable to perform ROS: dementia      DRUG ALLERGIES:   Allergies  Allergen Reactions  . Caffeine Other (See Comments)    Reaction:  Unknown   . Codeine Other (See Comments)    Reaction:  Unknown   . Fish Oil Other (See Comments)    Reaction:  Unknown   . Librium [Chlordiazepoxide] Other (See Comments)    Reaction:  Unknown   . Penicillins Other (See Comments)    Reaction:  Unknown   . Shellfish Allergy Other (See Comments)    Reaction:  Unknown   . Latex Rash  . Minocycline Itching, Swelling and Rash    VITALS:  Blood pressure 160/82, pulse 85, temperature 98.7 F (37.1 C), temperature source Oral, resp. rate 18, height 5\' 3"  (1.6 m), weight 49.261 kg (108 lb 9.6 oz), SpO2 99 %.  PHYSICAL EXAMINATION:  GENERAL:  82 y.o.-year-old patient lying in the bed with no acute distress.  EYES: Pupils equal, round, reactive to light and accommodation. No scleral icterus. Extraocular muscles intact.  HEENT: Head atraumatic, normocephalic. Oropharynx and nasopharynx clear.  NECK:  Supple, no jugular venous distention. No thyroid enlargement, no tenderness.  LUNGS: Normal breath sounds bilaterally, no wheezing, rales,rhonchi or crepitation. No use of accessory muscles of respiration.  CARDIOVASCULAR: S1, S2 normal. No murmurs, rubs, or gallops.  ABDOMEN: Soft, nontender, nondistended. Bowel sounds present. No organomegaly or mass.  EXTREMITIES: No pedal edema, cyanosis, or clubbing. Tenderness to calfs bilaterally. NEUROLOGIC: Cranial nerves II through XII are intact.  Muscle strength 5/5 in all extremities. Sensation intact. Gait not checked.  PSYCHIATRIC:  Episodes of  Confusion due to dementia,, SKIN: No obvious rash, lesion, or ulcer.    LABORATORY PANEL:   CBC  Recent Labs Lab 08/14/15 0524  WBC 15.5*  HGB 7.3*  HCT 23.0*  PLT 253   ------------------------------------------------------------------------------------------------------------------  Chemistries   Recent Labs Lab 08/14/15 0524  NA 151*  K 4.3  CL 117*  CO2 26  GLUCOSE 119*  BUN 40*  CREATININE 1.07*  CALCIUM 8.6*   ------------------------------------------------------------------------------------------------------------------  Cardiac Enzymes No results for input(s): TROPONINI in the last 168 hours. ------------------------------------------------------------------------------------------------------------------  RADIOLOGY:  No results found.  EKG:   Orders placed or performed during the hospital encounter of 06/08/15  . ED EKG  . ED EKG  . EKG 12-Lead  . EKG 12-Lead  . EKG    ASSESSMENT AND PLAN:   #1 acute DVT of the lower legs bilaterally: Likely precipitant factor in long journey to 13/03[place]16  And sedentary lifestyle. And recent immobilization; Echo Ef more than 55% . Continue Argatroban and coumadin.wait for INR to be 4 before stopping argatroban. D./w son. 2. Rheumatoid arthritis:on prednisone, #3 hypertension ; patient to be continued on Norvasc, Toprol  #4 hypothyroidism; continue Synthyroid. 5.History of CAD;continue statins.,bblockers 6.Dementia #7.,anemia without evidence of GI was bleeding: Monitor hemoglobin closely. We will consider transfusion further workup  If hb  continues to drop. 8.leucocytosis;due to prednisone. Chest x-ray is clear. #9 /Acute renal failure due to ATN from DVT;improving,c /2016.but  pt GFR fluctuates between 48 to 60. 10. Hypernatremia; due to dehydration. Patient is lethargic likely secondary to  hypernatremia: Continue D5 water. All the records are reviewed and case discussed with Care Management/Social Workerr. Management plans discussed with the patient, family and they are in agreement.  CODE STATUS: full TOTAL TIME TAKING CARE OF THIS PATIENT: .   POSSIBLE D/C IN 1-2  DAYS, DEPENDING ON CLINICAL CONDITION.   Katha Hamming M.D on 08/14/2015 at 2:20 PM  Between 7am to 6pm - Pager - [phone]  After 6pm go to [email or web address] - password [email or web address] [facility]  [facility]  Office  [phone]  CC: Primary care physician; Novella Olive, NP   Note: This dictation was prepared with Dragon dictation along with smaller phrase technology. Any transcriptional errors that result from this process are unintentional.

## 2015-08-14 NOTE — Care Management Important Message (Signed)
Important Message  Patient Details  Name: Bailey Lindsey MRN: [id number] Date of Birth: Feb 23, 1924   Medicare Important Message Given:  Yes    Olegario Messier A Taffany Heiser 08/14/2015, 11:33 AM

## 2015-08-14 NOTE — Progress Notes (Signed)
ANTICOAGULATION CONSULT NOTE -FOLLOW UP   Pharmacy Consult for argatroban Indication: DVT  Allergies  Allergen Reactions  . Caffeine Other (See Comments)    Reaction:  Unknown   . Codeine Other (See Comments)    Reaction:  Unknown   . Fish Oil Other (See Comments)    Reaction:  Unknown   . Librium [Chlordiazepoxide] Other (See Comments)    Reaction:  Unknown   . Penicillins Other (See Comments)    Reaction:  Unknown   . Shellfish Allergy Other (See Comments)    Reaction:  Unknown   . Latex Rash  . Minocycline Itching, Swelling and Rash    Patient Measurements: Height: 5\' 3"  (160 cm) Weight: 108 lb 9.6 oz (49.261 kg) IBW/kg (Calculated) : 52.4 Heparin Dosing Weight:   Vital Signs: Temp: 98.6 F (37 C) (01/09 2115) Temp Source: Oral (01/09 2115) BP: 148/64 mmHg (01/09 2115) Pulse Rate: 72 (01/09 2115)  Labs:  Recent Labs  08/12/15 0723 08/13/15 0444  08/14/15 0524  08/14/15 1404 08/14/15 1820 08/14/15 2118  HGB 8.2* 7.4*  --  7.3*  --   --   --   --   HCT 26.5* 23.6*  --  23.0*  --   --   --   --   PLT 265 267  --  253  --   --   --   --   APTT 95* 100*  < > 97*  < > 96* 82* 75*  LABPROT 31.2* 32.5*  --  36.4*  --   --   --   --   INR 3.08 3.25  --  3.78  --   --   --   --   CREATININE  --   --   --  1.07*  --   --   --   --   < > = values in this interval not displayed.  Estimated Creatinine Clearance: 27.7 mL/min (by C-G formula based on Cr of 1.07).   Medical History: Past Medical History  Diagnosis Date  . RA (rheumatoid arthritis) (HCC)   . Glaucoma     Followed by [facility]  . Hypertension   . Thyroid disease   . CAD (coronary artery disease)     Medications:  Infusions:  . argatroban 0.5 mcg/kg/min (08/14/15 1506)  . dextrose 50 mL/hr at 08/14/15 1505    Assessment: 89 yof with allergic reaction after receiving heparin. Starting argatroban for extensive DVTs. Currently at 0.8 mcg/kg/min.   1/9 14:00 aPTT supratherapeutic at  96 1/9 1820 APTT therapeutic at 82 1/9 2130 aPTT 75  Goal of Therapy:  Goal aPTT 45 to 95 seconds Monitor platelets by anticoagulation protocol: Yes   Plan:  APTT therapeutic. Continue rate at 0.5 mcg/kg/min, will recheck in 3 hours for a second confirmation level.   3 hour confirmation aPTT still therapeutic. Will recheck aPTT with tomorrow AM labs.   Nhung Danko S, Pharm.D. Clinical Pharmacist 08/14/2015,9:59 PM

## 2015-08-15 LAB — HEMOGLOBIN AND HEMATOCRIT, BLOOD
HEMATOCRIT: 21.3 % — AB (ref 35.0–47.0)
HEMOGLOBIN: 6.5 g/dL — AB (ref 12.0–16.0)

## 2015-08-15 LAB — CBC
HCT: 20.1 % — ABNORMAL LOW (ref 35.0–47.0)
HEMOGLOBIN: 6.3 g/dL — AB (ref 12.0–16.0)
MCH: 26.8 pg (ref 26.0–34.0)
MCHC: 31.2 g/dL — ABNORMAL LOW (ref 32.0–36.0)
MCV: 86 fL (ref 80.0–100.0)
PLATELETS: 201 10*3/uL (ref 150–440)
RBC: 2.34 MIL/uL — AB (ref 3.80–5.20)
RDW: 18.3 % — ABNORMAL HIGH (ref 11.5–14.5)
WBC: 14.5 10*3/uL — AB (ref 3.6–11.0)

## 2015-08-15 LAB — IRON AND TIBC
Iron: 13 ug/dL — ABNORMAL LOW (ref 28–170)
SATURATION RATIOS: 9 % — AB (ref 10.4–31.8)
TIBC: 150 ug/dL — AB (ref 250–450)
UIBC: 137 ug/dL

## 2015-08-15 LAB — BASIC METABOLIC PANEL
ANION GAP: 7 (ref 5–15)
BUN: 39 mg/dL — ABNORMAL HIGH (ref 6–20)
CHLORIDE: 114 mmol/L — AB (ref 101–111)
CO2: 26 mmol/L (ref 22–32)
Calcium: 8.2 mg/dL — ABNORMAL LOW (ref 8.9–10.3)
Creatinine, Ser: 1.31 mg/dL — ABNORMAL HIGH (ref 0.44–1.00)
GFR, EST AFRICAN AMERICAN: 41 mL/min — AB (ref >60)
GFR, EST NON AFRICAN AMERICAN: 35 mL/min — AB (ref >60)
Glucose, Bld: 148 mg/dL — ABNORMAL HIGH (ref 65–99)
POTASSIUM: 3.8 mmol/L (ref 3.5–5.1)
SODIUM: 147 mmol/L — AB (ref 135–145)

## 2015-08-15 LAB — PREPARE RBC (CROSSMATCH)

## 2015-08-15 LAB — PROTIME-INR
INR: 4.25 — AB
INR: 2.4
PROTHROMBIN TIME: 39.7 s — AB (ref 11.4–15.0)
Prothrombin Time: 25.9 seconds — ABNORMAL HIGH (ref 11.4–15.0)

## 2015-08-15 LAB — APTT: aPTT: 82 seconds — ABNORMAL HIGH (ref 24–36)

## 2015-08-15 LAB — ABO/RH: ABO/RH(D): O POS

## 2015-08-15 MED ORDER — SODIUM CHLORIDE 0.9 % IV SOLN
Freq: Once | INTRAVENOUS | Status: AC
  Administered 2015-08-15: 16:00:00 via INTRAVENOUS

## 2015-08-15 MED ORDER — FERROUS SULFATE 325 (65 FE) MG PO TABS
325.0000 mg | ORAL_TABLET | Freq: Two times a day (BID) | ORAL | Status: DC
  Administered 2015-08-15 – 2015-08-17 (×3): 325 mg via ORAL
  Filled 2015-08-15 (×3): qty 1

## 2015-08-15 MED ORDER — DEXTROSE 5 % IV SOLN
INTRAVENOUS | Status: DC
  Administered 2015-08-15: 18:00:00 via INTRAVENOUS

## 2015-08-15 MED ORDER — SODIUM CHLORIDE 0.9 % IV SOLN: Freq: Once | INTRAVENOUS | Status: DC

## 2015-08-15 NOTE — Progress Notes (Addendum)
[facility] Physicians - [place] at [facility]   PATIENT NAME: Bailey Lindsey    MR#:  [id number]  DATE OF BIRTH:  September 03, 1923  SUBJECTIVE: Admitted for bilateral leg DVT. Marland Kitchen Unable to give hisotry as she is demented.Bactroban stop up today because INR is more than 4. Patient is awake but not able to have good conversation because of underlying dementia. Sodium down to 147. Hemoglobin down to 6.3. No evidence of gross bleeding.   CHIEF COMPLAINT:   Chief Complaint  Patient presents with  . Leg Pain    REVIEW OF SYSTEMS:   Review of Systems  Unable to perform ROS: dementia      DRUG ALLERGIES:   Allergies  Allergen Reactions  . Caffeine Other (See Comments)    Reaction:  Unknown   . Codeine Other (See Comments)    Reaction:  Unknown   . Fish Oil Other (See Comments)    Reaction:  Unknown   . Librium [Chlordiazepoxide] Other (See Comments)    Reaction:  Unknown   . Penicillins Other (See Comments)    Reaction:  Unknown   . Shellfish Allergy Other (See Comments)    Reaction:  Unknown   . Latex Rash  . Minocycline Itching, Swelling and Rash    VITALS:  Blood pressure 146/71, pulse 83, temperature 97.8 F (36.6 C), temperature source Oral, resp. rate 18, height 5\' 3"  (1.6 m), weight 49.261 kg (108 lb 9.6 oz), SpO2 98 %.  PHYSICAL EXAMINATION:  GENERAL:  82 y.o.-year-old patient lying in the bed with no acute distress.  EYES: Pupils equal, round, reactive to light and accommodation. No scleral icterus. Extraocular muscles intact.  HEENT: Head atraumatic, normocephalic. Oropharynx and nasopharynx clear.  NECK:  Supple, no jugular venous distention. No thyroid enlargement, no tenderness.  LUNGS: Normal breath sounds bilaterally, no wheezing, rales,rhonchi or crepitation. No use of accessory muscles of respiration.  CARDIOVASCULAR: S1, S2 normal. No murmurs, rubs, or gallops.  ABDOMEN: Soft, nontender, nondistended. Bowel sounds present. No organomegaly or mass.   EXTREMITIES: No pedal edema, cyanosis, or clubbing. Tenderness to calfs bilaterally. NEUROLOGIC: Cranial nerves II through XII are intact. Muscle strength 5/5 in all extremities. Sensation intact. Gait not checked.  PSYCHIATRIC:  Episodes of  Confusion due to dementia,, SKIN: No obvious rash, lesion, or ulcer.    LABORATORY PANEL:   CBC  Recent Labs Lab 08/15/15 0557  WBC 14.5*  HGB 6.3*  HCT 20.1*  PLT 201   ------------------------------------------------------------------------------------------------------------------  Chemistries   Recent Labs Lab 08/15/15 0557  NA 147*  K 3.8  CL 114*  CO2 26  GLUCOSE 148*  BUN 39*  CREATININE 1.31*  CALCIUM 8.2*   ------------------------------------------------------------------------------------------------------------------  Cardiac Enzymes No results for input(s): TROPONINI in the last 168 hours. ------------------------------------------------------------------------------------------------------------------  RADIOLOGY:  No results found.  EKG:   Orders placed or performed during the hospital encounter of 06/08/15  . ED EKG  . ED EKG  . EKG 12-Lead  . EKG 12-Lead  . EKG    ASSESSMENT AND PLAN:   #1 acute DVT of the lower legs bilaterally: Likely precipitant factor in long journey to 13/03/16  And sedentary lifestyle. And recent immobilization; Echo Ef more than 55% .[place] Continue with Ativan, continue Coumadin. Recheck the INR to see if its more than 2. 2. Rheumatoid arthritis:on prednisone, #3 hypertension ; patient to be continued on Norvasc, Toprol  #4 hypothyroidism; continue Synthyroid. 5.History of CAD;continue statins.,bblockers 6.Dementia #7.,anemia without evidence of GI was bleeding: May have  underlying chronic anemia, baseline hemoglobin is 80.4 and crit 22.6. Check the stool for guaiac., Transfuse 1 unit of packed RBC today. 8.leucocytosis;due to prednisone. Chest x-ray is clear.no fever,hold off  on Abx. #9 /Acute renal failure due to ATN , from poor by mouth intake: Renal failure improved but again started worsening. With evidence of hypernatremia. Continue IV hydration. /2016.but pt GFR fluctuates between 48 to 60. 10. Hypernatremia; due to dehydration. Continue D5 water, improved sodium. Patient is lethargic likely secondary to hypernatremia: Improving today. Not stable for discharge. Because of hypernatremia./anemia.spoke with pts daughter .she agreed for it.  All the records are reviewed and case discussed with Care Management/Social Workerr. Management plans discussed with the patient, family and they are in agreement.  CODE STATUS:DNR TOTAL TIME TAKING CARE OF THIS PATIENT: .   POSSIBLE D/C IN 1-2  DAYS, DEPENDING ON CLINICAL CONDITION.   Katha Hamming M.D on 08/15/2015 at 9:45 AM  Between 7am to 6pm - Pager - [phone]  After 6pm go to [email or web address] - password [email or web address] [facility]  [place] [facility]  Office  [phone]  CC: Primary care physician; Novella Olive, NP   Note: This dictation was prepared with Dragon dictation along with smaller phrase technology. Any transcriptional errors that result from this process are unintentional.

## 2015-08-15 NOTE — Progress Notes (Signed)
ANTICOAGULATION CONSULT NOTE - FOLLOW UP   Pharmacy Consult for Coumadin Indication: DVT  Allergies  Allergen Reactions  . Caffeine Other (See Comments)    Reaction:  Unknown   . Codeine Other (See Comments)    Reaction:  Unknown   . Fish Oil Other (See Comments)    Reaction:  Unknown   . Librium [Chlordiazepoxide] Other (See Comments)    Reaction:  Unknown   . Penicillins Other (See Comments)    Reaction:  Unknown   . Shellfish Allergy Other (See Comments)    Reaction:  Unknown   . Latex Rash  . Minocycline Itching, Swelling and Rash    Patient Measurements: Height: 5\' 3"  (160 cm) Weight: 108 lb 9.6 oz (49.261 kg) IBW/kg (Calculated) : 52.4   Vital Signs: Temp: 98 F (36.7 C) (01/10 1400) Temp Source: Oral (01/10 1400) BP: 147/63 mmHg (01/10 1400) Pulse Rate: 81 (01/10 1400)  Labs:  Recent Labs  08/13/15 0444  08/14/15 0524  08/14/15 1820 08/14/15 2118 08/15/15 0557 08/15/15 1016 08/15/15 1427  HGB 7.4*  --  7.3*  --   --   --  6.3* 6.5*  --   HCT 23.6*  --  23.0*  --   --   --  20.1* 21.3*  --   PLT 267  --  253  --   --   --  201  --   --   APTT 100*  < > 97*  < > 82* 75* 82*  --   --   LABPROT 32.5*  --  36.4*  --   --   --  39.7*  --  25.9*  INR 3.25  --  3.78  --   --   --  4.25*  --  2.40  CREATININE  --   --  1.07*  --   --   --  1.31*  --   --   < > = values in this interval not displayed.  Estimated Creatinine Clearance: 22.7 mL/min (by C-G formula based on Cr of 1.31).  Assessment: 82 y/o F with B/L DVT on argatroban due to rxn to heparin to begin warfarin.   1/4 INR 3.22 - warfarin 2.5 1/5 INR 2.11 - warfarin 2.5 1/6 INR 3.05 - warfarin 2.5 1/7: INR: 3.08 - warfarin 2.5 1/8: INR: 3.25 - warfarin 3.5 1/9: INR: 3.78 - warfarin 3.5 1/10: INR 4.25 on argatroban 1/10: INR 2.40 off argatroban x6 hours  Goal of Therapy:  INR 2-3  However argatroban will falsely elevate INR. While patient is on argatroban will need to aim for an INR greater  than 4.   Plan:  INR is increasing. Now off argatroban, INR is within goal range but hard to tell where it will be tomorrow. Will continue with warfarin 3.5 mg daily for tomorrow and follow up on INR. INR ordered for AM.   3/9, PharmD, BCPS Clinical Pharmacist 08/15/2015 3:39 PM

## 2015-08-15 NOTE — Plan of Care (Signed)
Problem: Safety: Goal: Ability to remain free from injury will improve Outcome: Progressing Pt remains free from injury.  Falls precautions in place.

## 2015-08-15 NOTE — Progress Notes (Addendum)
ANTICOAGULATION CONSULT NOTE -FOLLOW UP   Pharmacy Consult for argatroban Indication: DVT  Allergies  Allergen Reactions  . Caffeine Other (See Comments)    Reaction:  Unknown   . Codeine Other (See Comments)    Reaction:  Unknown   . Fish Oil Other (See Comments)    Reaction:  Unknown   . Librium [Chlordiazepoxide] Other (See Comments)    Reaction:  Unknown   . Penicillins Other (See Comments)    Reaction:  Unknown   . Shellfish Allergy Other (See Comments)    Reaction:  Unknown   . Latex Rash  . Minocycline Itching, Swelling and Rash    Patient Measurements: Height: 5\' 3"  (160 cm) Weight: 108 lb 9.6 oz (49.261 kg) IBW/kg (Calculated) : 52.4 Heparin Dosing Weight:   Vital Signs: Temp: 98.4 F (36.9 C) (01/10 0449) Temp Source: Oral (01/09 2115) BP: 131/52 mmHg (01/10 0449) Pulse Rate: 75 (01/10 0449)  Labs:  Recent Labs  08/12/15 0723 08/13/15 0444  08/14/15 0524  08/14/15 1404 08/14/15 1820 08/14/15 2118 08/15/15 0557  HGB 8.2* 7.4*  --  7.3*  --   --   --   --  6.3*  HCT 26.5* 23.6*  --  23.0*  --   --   --   --  20.1*  PLT 265 267  --  253  --   --   --   --  201  APTT 95* 100*  < > 97*  < > 96* 82* 75*  --   LABPROT 31.2* 32.5*  --  36.4*  --   --   --   --   --   INR 3.08 3.25  --  3.78  --   --   --   --   --   CREATININE  --   --   --  1.07*  --   --   --   --  1.31*  < > = values in this interval not displayed.  Estimated Creatinine Clearance: 22.7 mL/min (by C-G formula based on Cr of 1.31).   Medical History: Past Medical History  Diagnosis Date  . RA (rheumatoid arthritis) (HCC)   . Glaucoma     Followed by [facility]  . Hypertension   . Thyroid disease   . CAD (coronary artery disease)     Medications:  Infusions:  . argatroban 0.5 mcg/kg/min (08/15/15 0420)  . dextrose 50 mL/hr at 08/14/15 1505    Assessment: 89 yof with allergic reaction after receiving heparin. Starting argatroban for extensive DVTs. Currently at  0.8 mcg/kg/min.   1/9 14:00 aPTT supratherapeutic at 96 1/9 1820 APTT therapeutic at 82 1/9 2130 aPTT 75  Goal of Therapy:  Goal aPTT 45 to 95 seconds Monitor platelets by anticoagulation protocol: Yes   Plan:  APTT therapeutic. Continue rate at 0.5 mcg/kg/min, will recheck in 3 hours for a second confirmation level.   3 hour confirmation aPTT still therapeutic. Will recheck aPTT with tomorrow AM labs.  1/10 AM hgb dropped by 1 point from 7.3 yesterday to 6.3 today. Notified hospitalist who wishes to d/c drip and continue warfarin. Argatroban drip d/c. RN will verify with rounding MD if needs to be restarted. Today's INR 4.25 and aPTT still pending.   Khamila Bassinger S, Pharm.D. Clinical Pharmacist 08/15/2015,6:33 AM

## 2015-08-15 NOTE — Progress Notes (Signed)
Pts 0630 Hgb was 6.3 and INR was 4.3.  Dr. Tobi Bastos ordered the argatroban to be discontinued at this time.  No other orders at this time.

## 2015-08-15 NOTE — Progress Notes (Signed)
ANTICOAGULATION CONSULT NOTE -FOLLOW UP   Pharmacy Consult for argatroban Indication: DVT  Allergies  Allergen Reactions  . Caffeine Other (See Comments)    Reaction:  Unknown   . Codeine Other (See Comments)    Reaction:  Unknown   . Fish Oil Other (See Comments)    Reaction:  Unknown   . Librium [Chlordiazepoxide] Other (See Comments)    Reaction:  Unknown   . Penicillins Other (See Comments)    Reaction:  Unknown   . Shellfish Allergy Other (See Comments)    Reaction:  Unknown   . Latex Rash  . Minocycline Itching, Swelling and Rash    Patient Measurements: Height: 5\' 3"  (160 cm) Weight: 108 lb 9.6 oz (49.261 kg) IBW/kg (Calculated) : 52.4   Vital Signs: Temp: 97.8 F (36.6 C) (01/10 0831) Temp Source: Oral (01/10 0831) BP: 146/71 mmHg (01/10 0831) Pulse Rate: 83 (01/10 0831)  Labs:  Recent Labs  08/13/15 0444  08/14/15 0524  08/14/15 1820 08/14/15 2118 08/15/15 0557  HGB 7.4*  --  7.3*  --   --   --  6.3*  HCT 23.6*  --  23.0*  --   --   --  20.1*  PLT 267  --  253  --   --   --  201  APTT 100*  < > 97*  < > 82* 75* 82*  LABPROT 32.5*  --  36.4*  --   --   --  39.7*  INR 3.25  --  3.78  --   --   --  4.25*  CREATININE  --   --  1.07*  --   --   --  1.31*  < > = values in this interval not displayed.  Estimated Creatinine Clearance: 22.7 mL/min (by C-G formula based on Cr of 1.31).   Medical History: Past Medical History  Diagnosis Date  . RA (rheumatoid arthritis) (HCC)   . Glaucoma     Followed by [facility]  . Hypertension   . Thyroid disease   . CAD (coronary artery disease)     Medications:  Infusions:  . dextrose 50 mL/hr at 08/15/15 0848    Assessment: 89 yof with allergic reaction after receiving heparin. Starting argatroban for extensive bilateral DVTs. Currently at 0.5 mcg/kg/min.   1/9 14:00 aPTT supratherapeutic at 96 1/9 1820 APTT therapeutic at 82 1/9 2130 aPTT 75 1/10 0557 aPTT 82 therapeutic  Goal of  Therapy:  Goal aPTT 45 to 95 seconds Monitor platelets by anticoagulation protocol: Yes   Plan:  1/10 AM hgb dropped by 1 point from 7.3 yesterday to 6.3 today. Notified hospitalist who wishes to d/c drip and continue warfarin. Argatroban drip d/c. RN will verify with rounding MD if needs to be restarted. Today's INR 4.25 and aPTT still pending.  Per protocol, will order INR for 6h after stopping argatroban (ordered for 1430 as MAR states argatroban drip stopped at 0815). Warfarin started on 1/4 and therefore overlap for >24 hours (>5 days as well).    3/4, Pharm.D. Clinical Pharmacist 08/15/2015,10:11 AM

## 2015-08-15 NOTE — Progress Notes (Signed)
Nutrition Follow-up   INTERVENTION:   Meals and Snacks: Cater to patient preferences; pt a feeder, per CNA Ayo pt only wanting liquids from tray this am. Provide much encouragement at meal times. Medical Food Supplement Therapy: Continue Ensure as pt is drinking some, will also send Magic Cup BID Coordination of Care: will request new weight as last weight on admission   NUTRITION DIAGNOSIS:   Inadequate oral intake related to acute illness as evidenced by meal completion < 25%.  GOAL:   Patient will meet greater than or equal to 90% of their needs; ongoing  MONITOR:    (Energy Intake, Electrolyte and Renal Profile, Anthropometrics, Digestive System)   ASSESSMENT:   Pt admitted with BLE DVT secondary to long trip to [place] and back per MD note.  Per MD note, following INR and Hgb. Pt to receive 1 unit pRBC transfusion today per MD note.   Diet Order:  DIET DYS 3 Room service appropriate?: Yes; Fluid consistency:: Thin    Current Nutrition: Per Ayo pt refused breakfast tray this am but did drink liquids well, including some of Ensure. Per documentation pt ate 50% of dinner last night and 10% of lunch yesterday.    Gastrointestinal Profile: Last BM: 08/14/2015   Scheduled Medications:  . sodium chloride   Intravenous Once  . sodium chloride   Intravenous Once  . acetaminophen  1,000 mg Oral BID  . amLODipine  10 mg Oral Daily  . atropine  1 drop Right Eye BID  . brimonidine  1 drop Right Eye BID   And  . timolol  1 drop Right Eye BID  . cholecalciferol  2,000 Units Oral Daily  . feeding supplement (ENSURE ENLIVE)  237 mL Oral TID WC  . ferrous sulfate  325 mg Oral BID WC  . latanoprost  1 drop Both Eyes QHS  . levothyroxine  75 mcg Oral QAC breakfast  . loratadine  10 mg Oral Daily  . memantine  28 mg Oral Daily  . metoprolol  50 mg Oral BID  . pravastatin  40 mg Oral QHS  . predniSONE  5 mg Oral QAC breakfast  . senna-docusate  2 tablet Oral BID  . sodium chloride   3 mL Intravenous Q12H  . warfarin  3.5 mg Oral q1800  . Warfarin - Pharmacist Dosing Inpatient   Does not apply q1800     Electrolyte/Renal Profile and Glucose Profile:   Recent Labs Lab 08/08/15 1755 08/09/15 0830 08/14/15 0524 08/15/15 0557  NA 148*  --  151* 147*  K 4.1  --  4.3 3.8  CL 109  --  117* 114*  CO2 26  --  26 26  BUN 46*  --  40* 39*  CREATININE 1.90* 1.52* 1.07* 1.31*  CALCIUM 8.8*  --  8.6* 8.2*  GLUCOSE 153*  --  119* 148*   Protein Profile: No results for input(s): ALBUMIN in the last 168 hours.    Weight Trend since Admission: Filed Weights   08/08/15 1737 08/08/15 2318  Weight: 104 lb 8 oz (47.4 kg) 108 lb 9.6 oz (49.261 kg)    BMI:  Body mass index is 19.24 kg/(m^2).  Estimated Nutritional Needs:   Kcal:  BEE: 727kcals, TEE: (IF 1.1-1.3)(AF 1.2) 960-1134kcals  Protein:  49-59g protein (1.0-1.2g/kg)  Fluid:  1230-1442mL of fluid (25-79mL/kg)  EDUCATION NEEDS:   No education needs identified at this time   HIGH Care Level  Leda Quail, RD, LDN Pager [phone] Weekend/On-Call Pager (  [phone]  

## 2015-08-16 LAB — BASIC METABOLIC PANEL
ANION GAP: 4 — AB (ref 5–15)
BUN: 31 mg/dL — ABNORMAL HIGH (ref 6–20)
CALCIUM: 8.1 mg/dL — AB (ref 8.9–10.3)
CO2: 24 mmol/L (ref 22–32)
Chloride: 112 mmol/L — ABNORMAL HIGH (ref 101–111)
Creatinine, Ser: 1.21 mg/dL — ABNORMAL HIGH (ref 0.44–1.00)
GFR, EST AFRICAN AMERICAN: 45 mL/min — AB (ref >60)
GFR, EST NON AFRICAN AMERICAN: 38 mL/min — AB (ref >60)
Glucose, Bld: 111 mg/dL — ABNORMAL HIGH (ref 65–99)
POTASSIUM: 3.8 mmol/L (ref 3.5–5.1)
SODIUM: 140 mmol/L (ref 135–145)

## 2015-08-16 LAB — HEMOGLOBIN AND HEMATOCRIT, BLOOD
HCT: 25.1 % — ABNORMAL LOW (ref 35.0–47.0)
Hemoglobin: 7.9 g/dL — ABNORMAL LOW (ref 12.0–16.0)

## 2015-08-16 LAB — TYPE AND SCREEN
ABO/RH(D): O POS
Antibody Screen: NEGATIVE
Unit division: 0

## 2015-08-16 LAB — PROTIME-INR
INR: 2.83
PROTHROMBIN TIME: 29.3 s — AB (ref 11.4–15.0)

## 2015-08-16 LAB — FERRITIN: Ferritin: 997 ng/mL — ABNORMAL HIGH (ref 11–307)

## 2015-08-16 MED ORDER — DARBEPOETIN ALFA 100 MCG/0.5ML IJ SOSY
200.0000 ug | PREFILLED_SYRINGE | Freq: Once | INTRAMUSCULAR | Status: AC
  Administered 2015-08-16: 200 ug via SUBCUTANEOUS
  Filled 2015-08-16: qty 0.4

## 2015-08-16 MED ORDER — FERROUS SULFATE 325 (65 FE) MG PO TABS: 325.0000 mg | ORAL_TABLET | Freq: Every day | ORAL | Status: AC

## 2015-08-16 MED ORDER — WARFARIN SODIUM 3 MG PO TABS: 3.0000 mg | ORAL_TABLET | Freq: Every day | ORAL | Status: AC

## 2015-08-16 MED ORDER — ENSURE ENLIVE PO LIQD: 237.0000 mL | Freq: Three times a day (TID) | ORAL | Status: AC

## 2015-08-16 MED ORDER — WARFARIN SODIUM 2 MG PO TABS: 3.0000 mg | ORAL_TABLET | Freq: Every day | ORAL | Status: DC

## 2015-08-16 NOTE — Progress Notes (Signed)
Patient ID: Bailey Lindsey, female   DOB: [birth year], 82 y.o.   MRN: [id number] [facility] PROGRESS NOTE  PCP: Novella Olive, NP  HPI/Subjective: Patient seen earlier and again now. I canceled discharge. This morning she was answering yes or no questions. This afternoon while they were in a [employer] out she was slumped over in the chair. I sternal rub during and she opened up her eyes. As per the daughter she does this even at home. At this point I feel I need to cancel the discharge.  Objective: Filed Vitals:   08/15/15 2131 08/16/15 0520  BP: 141/82 154/70  Pulse: 100 74  Temp: 98.1 F (36.7 C) 97.7 F (36.5 C)  Resp: 18 18    Intake/Output Summary (Last 24 hours) at 08/16/15 1507 Last data filed at 08/15/15 1854  Gross per 24 hour  Intake    278 ml  Output      0 ml  Net    278 ml   Filed Weights   08/08/15 2318 08/15/15 2220 08/16/15 0500  Weight: 49.261 kg (108 lb 9.6 oz) 47.083 kg (103 lb 12.8 oz) 47.764 kg (105 lb 4.8 oz)    ROS: Review of Systems  Unable to perform ROS  Exam: Physical Exam  Constitutional: She appears lethargic.  Neck: Normal carotid pulses present. Carotid bruit is not present.  Cardiovascular:  Murmur heard.  Systolic murmur is present with a grade of 2/6  Respiratory: Breath sounds normal. No accessory muscle usage. No respiratory distress.  GI: Soft. Bowel sounds are normal. She exhibits no distension. There is no tenderness.  Musculoskeletal:       Right ankle: She exhibits swelling.       Left ankle: She exhibits swelling.  Lymphadenopathy:    She has no cervical adenopathy.  Neurological: She appears lethargic.  Skin: Skin is warm. No rash noted.  Psychiatric:  Lethargic    Data Reviewed: Basic Metabolic Panel:  Recent Labs Lab 08/14/15 0524 08/15/15 0557 08/16/15 0557  NA 151* 147* 140  K 4.3 3.8 3.8  CL 117* 114* 112*  CO2 26 26 24   GLUCOSE 119* 148* 111*  BUN 40* 39* 31*  CREATININE 1.07* 1.31*  1.21*  CALCIUM 8.6* 8.2* 8.1*   CBC:  Recent Labs Lab 08/11/15 0500 08/12/15 0723 08/13/15 0444 08/14/15 0524 08/15/15 0557 08/15/15 1016 08/15/15 2330  WBC 12.9* 17.0* 15.3* 15.5* 14.5*  --   --   HGB 7.1* 8.2* 7.4* 7.3* 6.3* 6.5* 7.9*  HCT 22.8* 26.5* 23.6* 23.0* 20.1* 21.3* 25.1*  MCV 87.5 87.6 87.6 87.0 86.0  --   --   PLT 214 265 267 253 201  --   --     Scheduled Meds: . sodium chloride   Intravenous Once  . acetaminophen  1,000 mg Oral BID  . amLODipine  10 mg Oral Daily  . atropine  1 drop Right Eye BID  . brimonidine  1 drop Right Eye BID   And  . timolol  1 drop Right Eye BID  . cholecalciferol  2,000 Units Oral Daily  . feeding supplement (ENSURE ENLIVE)  237 mL Oral TID WC  . ferrous sulfate  325 mg Oral BID WC  . latanoprost  1 drop Both Eyes QHS  . levothyroxine  75 mcg Oral QAC breakfast  . loratadine  10 mg Oral Daily  . memantine  28 mg Oral Daily  . metoprolol  50 mg Oral BID  . pravastatin  40  mg Oral QHS  . predniSONE  5 mg Oral QAC breakfast  . senna-docusate  2 tablet Oral BID  . sodium chloride  3 mL Intravenous Q12H  . warfarin  3 mg Oral q1800  . Warfarin - Pharmacist Dosing Inpatient   Does not apply q1800    Assessment/Plan:  1. Lethargy- we'll watch overnight canceled discharge. Hold off on further workup and continue to monitor 2. Bilateral lower extremity DVT- therapeutic on Coumadin 3. Hypernatremia- back in normal range at this point. Overall prognosis is poor. 4. Anemia of chronic disease. Check a hemoglobin tomorrow morning. One dose of Aranesp given today. Also on ferrous sulfate. 5. Dementia 6. Rheumatoid arthritis on prednisone 7. Glaucoma on numerous eyedrops 8. Essential hypertension continue usual medications 9. Hypothyroidism unspecified on levothyroxine 10. History of coronary artery disease- aspirin on hold with the patient being on Coumadin  Code Status:     Code Status Orders        Start     Ordered    08/08/15 2317  Do not attempt resuscitation (DNR)   Continuous    Question Answer Comment  In the event of cardiac or respiratory ARREST Do not call a "code blue"   In the event of cardiac or respiratory ARREST Do not perform Intubation, CPR, defibrillation or ACLS   In the event of cardiac or respiratory ARREST Use medication by any route, position, wound care, and other measures to relive pain and suffering. May use oxygen, suction and manual treatment of airway obstruction as needed for comfort.      08/08/15 2316    Code Status History    Date Active Date Inactive Code Status Order ID Comments User Context   This patient has a current code status but no historical code status.    Advance Directive Documentation        Most Recent Value   Type of Advance Directive  Healthcare Power of Attorney, Living will   Pre-existing out of facility DNR order (yellow form or pink MOST form)     "MOST" Form in Place?       Family Communication: Spoke with daughter on phone earlier today and at the bedside this afternoon Disposition Plan: Cancel discharge today. Reevaluate tomorrow  Time spent: 40 minutes  Alford Highland  [facility]

## 2015-08-16 NOTE — Discharge Summary (Addendum)
[facility] - [place] at [facility]   PATIENT NAME: Bailey Lindsey    MR#:  [id number]  DATE OF BIRTH:  1924/02/18  DATE OF ADMISSION:  08/08/2015 ADMITTING PHYSICIAN: Enedina Finner, MD  DATE OF DISCHARGE: 08/17/2015  PRIMARY CARE PHYSICIAN: Novella Olive, NP    ADMISSION DIAGNOSIS:  DVT (deep venous thrombosis), bilateral [I82.403]  DISCHARGE DIAGNOSIS:  Active Problems:   DVT, bilateral lower limbs (HCC)   SECONDARY DIAGNOSIS:   Past Medical History  Diagnosis Date  . RA (rheumatoid arthritis) (HCC)   . Glaucoma     Followed by [facility]  . Hypertension   . Thyroid disease   . CAD (coronary artery disease)     HOSPITAL COURSE:   1. Deep vein thrombosis bilateral lower extremities. Patient was initially started on heparin drip and developed angioedema and then switched argatroban.  Patient was started on Coumadin and is now therapeutic. INR to be monitored by the pace program. 2. Anemia of chronic disease- patient was transfused a unit of red blood cells during the hospital course and hemoglobin came up to 8.0. I gave 1 dose of Aranesp yesterday. Continue consider continuing this as outpatient. Also will give ferrous sulfate. Ferritin is high going along with anemia of chronic disease. 3. Hypernatremia- with this overall prognosis is poor. Likely dementia is further along than family thinks. Sodium upon discharge 142. 4. Rheumatoid arthritis on chronic prednisone 5. Glaucoma on numerous eyedrops 6. Essential hypertension continue current medications 7. Hypothyroidism continue levothyroxine 8. History of coronary artery disease- hold aspirin with anemia and being on Coumadin 9. Lethargy yesterday afternoon- no workup done, patient answering so yes or no questions today  DISCHARGE CONDITIONS:   Satisfactory  CONSULTS OBTAINED:  Treatment Team:  Annice Needy, MD  DRUG ALLERGIES:   Allergies  Allergen Reactions  . Caffeine Other (See  Comments)    Reaction:  Unknown   . Codeine Other (See Comments)    Reaction:  Unknown   . Fish Oil Other (See Comments)    Reaction:  Unknown   . Librium [Chlordiazepoxide] Other (See Comments)    Reaction:  Unknown   . Penicillins Other (See Comments)    Reaction:  Unknown   . Shellfish Allergy Other (See Comments)    Reaction:  Unknown   . Latex Rash  . Minocycline Itching, Swelling and Rash    DISCHARGE MEDICATIONS:   Current Discharge Medication List    START taking these medications   Details  feeding supplement, ENSURE ENLIVE, (ENSURE ENLIVE) LIQD Take 237 mLs by mouth 3 (three) times daily with meals. Qty: 237 mL, Refills: 2    ferrous sulfate 325 (65 FE) MG tablet Take 1 tablet (325 mg total) by mouth daily with breakfast. Qty: 30 tablet, Refills: 0    warfarin (COUMADIN) 3 MG tablet Take 1 tablet (3 mg total) by mouth daily at 6 PM. Qty: 30 tablet, Refills: 0      CONTINUE these medications which have NOT CHANGED   Details  acetaminophen (TYLENOL) 500 MG tablet Take 1,000 mg by mouth 2 (two) times daily.     alendronate (FOSAMAX) 70 MG tablet Take 70 mg by mouth once a week. Pt takes on Thursday. Take with a full glass of water on an empty stomach.    amLODipine (NORVASC) 10 MG tablet Take 10 mg by mouth daily.    atropine 1 % ophthalmic solution Place 1 drop into the right eye 2 (two) times  daily.    brimonidine-timolol (COMBIGAN) 0.2-0.5 % ophthalmic solution Place 1 drop into the right eye 2 (two) times daily.     Carboxymethylcellul-Glycerin (OPTIVE) 0.5-0.9 % SOLN Apply 1-2 drops to eye 4 (four) times daily as needed (dryness/irritation).    cholecalciferol (VITAMIN D) 1000 units tablet Take 2,000 Units by mouth daily.    levothyroxine (SYNTHROID, LEVOTHROID) 75 MCG tablet Take 75 mcg by mouth daily before breakfast.    loratadine (CLARITIN) 10 MG tablet Take 10 mg by mouth daily.    memantine (NAMENDA XR) 28 MG CP24 24 hr capsule Take 28 mg by  mouth daily.    metoprolol (LOPRESSOR) 50 MG tablet Take 50 mg by mouth 2 (two) times daily.    polyethylene glycol (MIRALAX / GLYCOLAX) packet Take 17 g by mouth daily as needed for moderate constipation.    pravastatin (PRAVACHOL) 40 MG tablet Take 40 mg by mouth at bedtime.     predniSONE (DELTASONE) 5 MG tablet Take 5 mg by mouth daily.    sennosides-docusate sodium (SENOKOT-S) 8.6-50 MG tablet Take 2 tablets by mouth 2 (two) times daily.    sodium chloride (OCEAN) 0.65 % SOLN nasal spray Place 1 spray into both nostrils as needed for congestion.    travoprost, benzalkonium, (TRAVATAN) 0.004 % ophthalmic solution Place 1 drop into both eyes at bedtime.    traZODone (DESYREL) 50 MG tablet Take 12.5 mg by mouth at bedtime as needed for sleep.    Zinc Oxide (DESITIN) 13 % CREA Apply 1 application topically 2 (two) times daily as needed (for irritation).      STOP taking these medications     aspirin 81 MG chewable tablet      enalapril (VASOTEC) 5 MG tablet          DISCHARGE INSTRUCTIONS:   Follow-up with the pace program. Prescriptions E prescribed into pharmacy.  If you experience worsening of your admission symptoms, develop shortness of breath, life threatening emergency, suicidal or homicidal thoughts you must seek medical attention immediately by calling 911 or calling your MD immediately  if symptoms less severe.  You Must read complete instructions/literature along with all the possible adverse reactions/side effects for all the Medicines you take and that have been prescribed to you. Take any new Medicines after you have completely understood and accept all the possible adverse reactions/side effects.   Please note  You were cared for by a hospitalist during your hospital stay. If you have any questions about your discharge medications or the care you received while you were in the hospital after you are discharged, you can call the unit and asked to speak with  the hospitalist on call if the hospitalist that took care of you is not available. Once you are discharged, your primary care physician will handle any further medical issues. Please note that NO REFILLS for any discharge medications will be authorized once you are discharged, as it is imperative that you return to your primary care physician (or establish a relationship with a primary care physician if you do not have one) for your aftercare needs so that they can reassess your need for medications and monitor your lab values.    Today   CHIEF COMPLAINT:   Chief Complaint  Patient presents with  . Leg Pain    HISTORY OF PRESENT ILLNESS:  Bailey Lindsey  is a 82 y.o. female found to have bilateral lower extremity DVT   VITAL SIGNS:  Blood pressure 146/72, pulse 81, temperature  99 F (37.2 C), temperature source Oral, resp. rate 16, height 5\' 3"  (1.6 m), weight 47.764 kg (105 lb 4.8 oz), SpO2 100 %.    PHYSICAL EXAMINATION:  GENERAL:  82 y.o.-year-old patient lying in the bed with no acute distress.  EYES: Pupils equal, round, reactive to light and accommodation. No scleral icterus. Extraocular muscles intact.  HEENT: Head atraumatic, normocephalic. Oropharynx and nasopharynx clear.  NECK:  Supple, no jugular venous distention. No thyroid enlargement, no tenderness.  LUNGS: Normal breath sounds bilaterally, no wheezing, rales,rhonchi or crepitation. No use of accessory muscles of respiration.  CARDIOVASCULAR: S1, S2 normal. No murmurs, rubs, or gallops.  ABDOMEN: Soft, non-tender, non-distended. Bowel sounds present. No organomegaly or mass.  EXTREMITIES: No edema, cyanosis, or clubbing.  NEUROLOGIC:  Patient answers some yes or no questions. Gait not checked.  PSYCHIATRIC: The patient is alert and answers some yes or no questions.  SKIN: No obvious rash, lesion, or ulcer.   DATA REVIEW:   CBC  Recent Labs Lab 08/15/15 0557  08/15/15 2330 08/17/15 0647  WBC 14.5*  --   --    --   HGB 6.3*  < > 7.9* 8.0*  HCT 20.1*  < > 25.1*  --   PLT 201  --   --   --   < > = values in this interval not displayed.  Chemistries   Recent Labs Lab 08/17/15 0647  NA 142  K 3.7  CL 111  CO2 24  GLUCOSE 129*  BUN 38*  CREATININE 1.21*  CALCIUM 8.1*    Microbiology Results  Results for orders placed or performed in visit on 04/22/14  Culture, blood (single)     Status: None   Collection Time: 04/22/14  3:01 PM  Result Value Ref Range Status   Micro Text Report   Final       SOURCE: LEFT ARM    COMMENT                   NO GROWTH AEROBICALLY/ANAEROBICALLY IN 5 DAYS   ANTIBIOTIC                                                      Culture, blood (single)     Status: None   Collection Time: 04/22/14  3:02 PM  Result Value Ref Range Status   Micro Text Report   Final       COMMENT                   NO GROWTH AEROBICALLY/ANAEROBICALLY IN 5 DAYS   ANTIBIOTIC                                                        Management plans discussed with the patient, family and they are in agreement.  CODE STATUS:     Code Status Orders        Start     Ordered   08/08/15 2317  Do not attempt resuscitation (DNR)   Continuous    Question Answer Comment  In the event of cardiac or respiratory ARREST Do not call a "code blue"   In  the event of cardiac or respiratory ARREST Do not perform Intubation, CPR, defibrillation or ACLS   In the event of cardiac or respiratory ARREST Use medication by any route, position, wound care, and other measures to relive pain and suffering. May use oxygen, suction and manual treatment of airway obstruction as needed for comfort.      08/08/15 2316    Code Status History    Date Active Date Inactive Code Status Order ID Comments User Context   This patient has a current code status but no historical code status.    Advance Directive Documentation        Most Recent Value   Type of Advance Directive  Healthcare Power of Attorney,  Living will   Pre-existing out of facility DNR order (yellow form or pink MOST form)     "MOST" Form in Place?        TOTAL TIME TAKING CARE OF THIS PATIENT: 35 minutes.    Alford Highland M.D on 08/17/15 at 830am  Between 7am to 6pm - Pager - [phone]  After 6pm go to [email or web address] - password [email or web address] [facility]  [facility]  Office  [phone]  CC: Primary care physician; Novella Olive, NP

## 2015-08-16 NOTE — Progress Notes (Signed)
Patient ID: Bailey Lindsey, female   DOB: 03/07/1924, 82 y.o.   MRN: [id number]  Patient was getting ready to go home and in chair ready to wheel out and she was sleepy.  I sternal rubbed her and she woke up but did not talk with me.  I spoke with the daughter and she stated that sometimes she does this if she doesn't want to deal with things. The daughter stated she will come over and see her.  Potentially will have to stop discharge and reevaluate tomorrow.  Dr. Alford Highland

## 2015-08-16 NOTE — Care Management Important Message (Signed)
Important Message  Patient Details  Name: Bailey Lindsey MRN: [id number] Date of Birth: 1924-06-08   Medicare Important Message Given:  Yes    Olegario Messier A Gearline Spilman 08/16/2015, 11:39 AM

## 2015-08-16 NOTE — Plan of Care (Signed)
Problem: Education: Goal: Knowledge of [facility] Education information/materials will improve Outcome: Progressing Pt has dementia and is a poor historian.  Family very involved in her care.  Problem: Safety: Goal: Ability to remain free from injury will improve Outcome: Progressing Pt free from injury.  Fall precautions in place.

## 2015-08-16 NOTE — Progress Notes (Signed)
ANTICOAGULATION CONSULT NOTE - FOLLOW UP   Pharmacy Consult for Coumadin Indication: DVT  Allergies  Allergen Reactions  . Caffeine Other (See Comments)    Reaction:  Unknown   . Codeine Other (See Comments)    Reaction:  Unknown   . Fish Oil Other (See Comments)    Reaction:  Unknown   . Librium [Chlordiazepoxide] Other (See Comments)    Reaction:  Unknown   . Penicillins Other (See Comments)    Reaction:  Unknown   . Shellfish Allergy Other (See Comments)    Reaction:  Unknown   . Latex Rash  . Minocycline Itching, Swelling and Rash    Patient Measurements: Height: 5\' 3"  (160 cm) Weight: 105 lb 4.8 oz (47.764 kg) IBW/kg (Calculated) : 52.4   Vital Signs: Temp: 97.7 F (36.5 C) (01/11 0520) Temp Source: Oral (01/11 0520) BP: 154/70 mmHg (01/11 0520) Pulse Rate: 74 (01/11 0520)  Labs:  Recent Labs  08/14/15 0524  08/14/15 1820 08/14/15 2118 08/15/15 0557 08/15/15 1016 08/15/15 1427 08/15/15 2330 08/16/15 0557  HGB 7.3*  --   --   --  6.3* 6.5*  --  7.9*  --   HCT 23.0*  --   --   --  20.1* 21.3*  --  25.1*  --   PLT 253  --   --   --  201  --   --   --   --   APTT 97*  < > 82* 75* 82*  --   --   --   --   LABPROT 36.4*  --   --   --  39.7*  --  25.9*  --  29.3*  INR 3.78  --   --   --  4.25*  --  2.40  --  2.83  CREATININE 1.07*  --   --   --  1.31*  --   --   --  1.21*  < > = values in this interval not displayed.  Estimated Creatinine Clearance: 23.8 mL/min (by C-G formula based on Cr of 1.21).  Assessment: 82 y/o F with B/L DVT on argatroban due to rxn to heparin to begin warfarin.   1/4 INR 3.22 - warfarin 2.5 1/5 INR 2.11 - warfarin 2.5 1/6 INR 3.05 - warfarin 2.5 1/7: INR: 3.08 - warfarin 2.5 1/8: INR: 3.25 - warfarin 3.5 1/9: INR: 3.78 - warfarin 3.5 1/10: INR 4.25 on argatroban 1/10: INR 2.40 off argatroban x6 hours 1/11: INR 2.83   Goal of Therapy:  INR 2-3  However argatroban will falsely elevate INR. While patient is on argatroban  will need to aim for an INR greater than 4.   Plan:  INR is increasing. Now off argatroban, INR is within goal range but hard to tell where it will be tomorrow. Will reduce warfarin to 3 mg PO daily. Will  follow up on INR. INR ordered for AM.   3/11, PharmD, BCPS Clinical Pharmacist 08/16/2015 8:48 AM

## 2015-08-16 NOTE — Care Management (Addendum)
Discharge to home today per Dr. Renae Gloss. Telephone call to [facility] program. Spoke with Nurse, who transferred me to the Mining engineer.    Spoke with Dr. Van Clines. Spoke with nurse, will inform transportation that Ms. Blok can be picked up at 1:00pm today  Update to Venancio Poisson Styles (daughter) Gwenette Greet RN MSN CCM Care Management [phone]

## 2015-08-16 NOTE — Discharge Instructions (Signed)
Deep Vein Thrombosis °A deep vein thrombosis (DVT) is a blood clot (thrombus) that usually occurs in a deep, larger vein of the lower leg or the pelvis, or in an upper extremity such as the arm. These are dangerous and can lead to serious and even life-threatening complications if the clot travels to the lungs. °A DVT can damage the valves in your leg veins so that instead of flowing upward, the blood pools in the lower leg. This is called post-thrombotic syndrome, and it can result in pain, swelling, discoloration, and sores on the leg. °CAUSES °A DVT is caused by the formation of a blood clot in your leg, pelvis, or arm. Usually, several things contribute to the formation of blood clots. A clot may develop when: °· Your blood flow slows down. °· Your vein becomes damaged in some way. °· You have a condition that makes your blood clot more easily. °RISK FACTORS °A DVT is more likely to develop in: °· People who are older, especially over 60 years of age. °· People who are overweight (obese). °· People who sit or lie still for a long time, such as during long-distance travel (over 4 hours), bed rest, hospitalization, or during recovery from certain medical conditions like a stroke. °· People who do not engage in much physical activity (sedentary lifestyle). °· People who have chronic breathing disorders. °· People who have a personal or family history of blood clots or blood clotting disease. °· People who have peripheral vascular disease (PVD), diabetes, or some types of cancer. °· People who have heart disease, especially if the person had a recent heart attack or has congestive heart failure. °· People who have neurological diseases that affect the legs (leg paresis). °· People who have had a traumatic injury, such as breaking a hip or leg. °· People who have recently had major or lengthy surgery, especially on the hip, knee, or abdomen. °· People who have had a central line placed inside a large vein. °· People  who take medicines that contain the hormone estrogen. These include birth control pills and hormone replacement therapy. °· Pregnancy or during childbirth or the postpartum period. °· Long plane flights (over 8 hours). °SIGNS AND SYMPTOMS °Symptoms of a DVT can include:  °· Swelling of your leg or arm, especially if one side is much worse. °· Warmth and redness of your leg or arm, especially if one side is much worse. °· Pain in your arm or leg. If the clot is in your leg, symptoms may be more noticeable or worse when you stand or walk. °· A feeling of pins and needles, if the clot is in the arm. °The symptoms of a DVT that has traveled to the lungs (pulmonary embolism, PE) usually start suddenly and include: °· Shortness of breath while active or at rest. °· Coughing or coughing up blood or blood-tinged mucus. °· Chest pain that is often worse with deep breaths. °· Rapid or irregular heartbeat. °· Feeling light-headed or dizzy. °· Fainting. °· Feeling anxious. °· Sweating. °There may also be pain and swelling in a leg if that is where the blood clot started. °These symptoms may represent a serious problem that is an emergency. Do not wait to see if the symptoms will go away. Get medical help right away. Call your local emergency services (911 in the [place] Do not drive yourself to the hospital. °DIAGNOSIS °Your health care provider will take a medical history and perform a physical exam. You may also   have other tests, including: °· Blood tests to assess the clotting properties of your blood. °· Imaging tests, such as CT, ultrasound, MRI, X-ray, and other tests to see if you have clots anywhere in your body. °TREATMENT °After a DVT is identified, it can be treated. The type of treatment that you receive depends on many factors, such as the cause of your DVT, your risk for bleeding or developing more clots, and other medical conditions that you have. Sometimes, a combination of treatments is necessary. Treatment  options may be combined and include: °· Monitoring the blood clot with ultrasound. °· Taking medicines by mouth, such as newer blood thinners (anticoagulants), thrombolytics, or warfarin. °· Taking anticoagulant medicine by injection or through an IV tube. °· Wearing compression stockings or using different types of devices. °· Surgery (rare) to remove the blood clot or to place a filter in your abdomen to stop the blood clot from traveling to your lungs. °Treatments for a DVT are often divided into immediate treatment and long-term treatment (up to 3 months after DVT). You can work with your health care provider to choose the treatment program that is best for you. °HOME CARE INSTRUCTIONS °If you are taking a newer oral anticoagulant: °· Take the medicine every single day at the same time each day. °· Understand what foods and drugs interact with this medicine. °· Understand that there are no regular blood tests required when using this medicine. °· Understand the side effects of this medicine, including excessive bruising or bleeding. Ask your health care provider or pharmacist about other possible side effects. °If you are taking warfarin: °· Understand how to take warfarin and know which foods can affect how warfarin works in your body. °· Understand that it is dangerous to take too much or too little warfarin. Too much warfarin increases the risk of bleeding. Too little warfarin continues to allow the risk for blood clots. °· Follow your PT and INR blood testing schedule. The PT and INR results allow your health care provider to adjust your dose of warfarin. It is very important that you have your PT and INR tested as often as told by your health care provider. °· Avoid major changes in your diet, or tell your health care provider before you change your diet. Arrange a visit with a registered dietitian to answer your questions. Many foods, especially foods that are high in vitamin K, can interfere with warfarin  and affect the PT and INR results. Eat a consistent amount of foods that are high in vitamin K, such as: °¨ Spinach, kale, broccoli, cabbage, collard greens, turnip greens, Brussels sprouts, peas, cauliflower, seaweed, and parsley. °¨ Beef liver and pork liver. °¨ Green tea. °¨ Soybean oil. °· Tell your health care provider about any and all medicines, vitamins, and supplements that you take, including aspirin and other over-the-counter anti-inflammatory medicines. Be especially cautious with aspirin and anti-inflammatory medicines. Do not take those before you ask your health care provider if it is safe to do so. This is important because many medicines can interfere with warfarin and affect the PT and INR results. °· Do not start or stop taking any over-the-counter or prescription medicine unless your health care provider or pharmacist tells you to do so. °If you take warfarin, you will also need to do these things: °· Hold pressure over cuts for longer than usual. °· Tell your dentist and other health care providers that you are taking warfarin before you have any procedures in which   bleeding may occur. °· Avoid alcohol or drink very small amounts. Tell your health care provider if you change your alcohol intake. °· Do not use tobacco products, including cigarettes, chewing tobacco, and e-cigarettes. If you need help quitting, ask your health care provider. °· Avoid contact sports. °General Instructions °· Take over-the-counter and prescription medicines only as told by your health care provider. Anticoagulant medicines can have side effects, including easy bruising and difficulty stopping bleeding. If you are prescribed an anticoagulant, you will also need to do these things: °¨ Hold pressure over cuts for longer than usual. °¨ Tell your dentist and other health care providers that you are taking anticoagulants before you have any procedures in which bleeding may occur. °¨ Avoid contact sports. °· Wear a medical  alert bracelet or carry a medical alert card that says you have had a PE. °· Ask your health care provider how soon you can go back to your normal activities. Stay active to prevent new blood clots from forming. °· Make sure to exercise while traveling or when you have been sitting or standing for a long period of time. It is very important to exercise. Exercise your legs by walking or by tightening and relaxing your leg muscles often. Take frequent walks. °· Wear compression stockings as told by your health care provider to help prevent more blood clots from forming. °· Do not use tobacco products, including cigarettes, chewing tobacco, and e-cigarettes. If you need help quitting, ask your health care provider. °· Keep all follow-up appointments with your health care provider. This is important. °PREVENTION °Take these actions to decrease your risk of developing another DVT: °· Exercise regularly. For at least 30 minutes every day, engage in: °¨ Activity that involves moving your arms and legs. °¨ Activity that encourages good blood flow through your body by increasing your heart rate. °· Exercise your arms and legs every hour during long-distance travel (over 4 hours). Drink plenty of water and avoid drinking alcohol while traveling. °· Avoid sitting or lying in bed for long periods of time without moving your legs. °· Maintain a weight that is appropriate for your height. Ask your health care provider what weight is healthy for you. °· If you are a woman who is over 35 years of age, avoid unnecessary use of medicines that contain estrogen. These include birth control pills. °· Do not smoke, especially if you take estrogen medicines. If you need help quitting, ask your health care provider. °If you are hospitalized, prevention measures may include: °· Early walking after surgery, as soon as your health care provider says that it is safe. °· Receiving anticoagulants to prevent blood clots. If you cannot take  anticoagulants, other options may be available, such as wearing compression stockings or using different types of devices. °SEEK IMMEDIATE MEDICAL CARE IF: °· You have new or increased pain, swelling, or redness in an arm or leg. °· You have numbness or tingling in an arm or leg. °· You have shortness of breath while active or at rest. °· You have chest pain. °· You have a rapid or irregular heartbeat. °· You feel light-headed or dizzy. °· You cough up blood. °· You notice blood in your vomit, bowel movement, or urine. °These symptoms may represent a serious problem that is an emergency. Do not wait to see if the symptoms will go away. Get medical help right away. Call your local emergency services (911 in the [place] Do not drive yourself to the hospital. °  °  This information is not intended to replace advice given to you by your health care provider. Make sure you discuss any questions you have with your health care provider. °  °Document Released: 07/22/2005 Document Revised: 04/12/2015 Document Reviewed: 11/16/2014 °Elsevier Interactive Patient Education ©2016 [employer] ° °

## 2015-08-17 LAB — HEMOGLOBIN: HEMOGLOBIN: 8 g/dL — AB (ref 12.0–16.0)

## 2015-08-17 LAB — BASIC METABOLIC PANEL
Anion gap: 7 (ref 5–15)
BUN: 38 mg/dL — AB (ref 6–20)
CHLORIDE: 111 mmol/L (ref 101–111)
CO2: 24 mmol/L (ref 22–32)
Calcium: 8.1 mg/dL — ABNORMAL LOW (ref 8.9–10.3)
Creatinine, Ser: 1.21 mg/dL — ABNORMAL HIGH (ref 0.44–1.00)
GFR calc Af Amer: 45 mL/min — ABNORMAL LOW (ref >60)
GFR calc non Af Amer: 38 mL/min — ABNORMAL LOW (ref >60)
GLUCOSE: 129 mg/dL — AB (ref 65–99)
POTASSIUM: 3.7 mmol/L (ref 3.5–5.1)
SODIUM: 142 mmol/L (ref 135–145)

## 2015-08-17 LAB — PROTIME-INR
INR: 2.88
Prothrombin Time: 29.7 seconds — ABNORMAL HIGH (ref 11.4–15.0)

## 2015-08-17 NOTE — Progress Notes (Signed)
ANTICOAGULATION CONSULT NOTE - FOLLOW UP   Pharmacy Consult for Coumadin Indication: DVT  Allergies  Allergen Reactions  . Caffeine Other (See Comments)    Reaction:  Unknown   . Codeine Other (See Comments)    Reaction:  Unknown   . Fish Oil Other (See Comments)    Reaction:  Unknown   . Librium [Chlordiazepoxide] Other (See Comments)    Reaction:  Unknown   . Penicillins Other (See Comments)    Reaction:  Unknown   . Shellfish Allergy Other (See Comments)    Reaction:  Unknown   . Latex Rash  . Minocycline Itching, Swelling and Rash    Patient Measurements: Height: 5\' 3"  (160 cm) Weight: 105 lb 4.8 oz (47.764 kg) IBW/kg (Calculated) : 52.4   Vital Signs: Temp: 99 F (37.2 C) (01/12 0505) Temp Source: Oral (01/12 0505) BP: 146/72 mmHg (01/12 0505) Pulse Rate: 81 (01/12 0505)  Labs:  Recent Labs  08/14/15 1820 08/14/15 2118  08/15/15 0557 08/15/15 1016 08/15/15 1427 08/15/15 2330 08/16/15 0557 08/17/15 0647  HGB  --   --   < > 6.3* 6.5*  --  7.9*  --  8.0*  HCT  --   --   --  20.1* 21.3*  --  25.1*  --   --   PLT  --   --   --  201  --   --   --   --   --   APTT 82* 75*  --  82*  --   --   --   --   --   LABPROT  --   --   < > 39.7*  --  25.9*  --  29.3* 29.7*  INR  --   --   < > 4.25*  --  2.40  --  2.83 2.88  CREATININE  --   --   --  1.31*  --   --   --  1.21* 1.21*  < > = values in this interval not displayed.  Estimated Creatinine Clearance: 23.8 mL/min (by C-G formula based on Cr of 1.21).  Assessment: 82 y/o F with B/L DVT on argatroban due to rxn to heparin to begin warfarin.   1/4 INR 3.22 - warfarin 2.5 1/5 INR 2.11 - warfarin 2.5 1/6 INR 3.05 - warfarin 2.5 1/7: INR: 3.08 - warfarin 2.5 1/8: INR: 3.25 - warfarin 3.5 1/9: INR: 3.78 - warfarin 3.5 1/10: INR 4.25 on argatroban 1/10: INR 2.40 off argatroban x6 hours 1/11: INR 2.83; warfarin 3 mg  1/12: INR: 2.88  Goal of Therapy:  INR 2-3    Plan:  INR is increasing. Now off  argatroban, INR is within goal range. Continue warfarin to 3 mg PO daily. Will  follow up on INR. INR ordered for AM.   3/12, PharmD, BCPS Clinical Pharmacist 08/17/2015 9:12 AM

## 2015-08-17 NOTE — Progress Notes (Addendum)
Patient discharged home with [facility]. IV already removed.  Discharge instructions given to patient and caregiver.  All questions answered. Caregiver verbalized understanding.  [facility] to pick up patient at 1200.  [facility] picked up patient. Caregiver took patient belongings.

## 2015-08-27 ENCOUNTER — Emergency Department: Payer: Medicare (Managed Care)

## 2015-08-27 ENCOUNTER — Emergency Department
Admission: EM | Admit: 2015-08-27 | Discharge: 2015-08-27 | Disposition: A | Discharge status: Home / Self Care | Payer: Medicare (Managed Care) | Attending: Emergency Medicine | Admitting: Emergency Medicine

## 2015-08-27 DIAGNOSIS — M4856XA Collapsed vertebra, not elsewhere classified, lumbar region, initial encounter for fracture: Secondary | ICD-10-CM | POA: Insufficient documentation

## 2015-08-27 DIAGNOSIS — I10 Essential (primary) hypertension: Secondary | ICD-10-CM | POA: Insufficient documentation

## 2015-08-27 DIAGNOSIS — Z79899 Other long term (current) drug therapy: Secondary | ICD-10-CM | POA: Diagnosis not present

## 2015-08-27 DIAGNOSIS — Z91040 Latex allergy status: Secondary | ICD-10-CM | POA: Insufficient documentation

## 2015-08-27 DIAGNOSIS — IMO0002 Reserved for concepts with insufficient information to code with codable children: Secondary | ICD-10-CM

## 2015-08-27 DIAGNOSIS — Z7952 Long term (current) use of systemic steroids: Secondary | ICD-10-CM | POA: Insufficient documentation

## 2015-08-27 DIAGNOSIS — Z880 Allergy status to penicillin: Secondary | ICD-10-CM | POA: Diagnosis not present

## 2015-08-27 DIAGNOSIS — Z7951 Long term (current) use of inhaled steroids: Secondary | ICD-10-CM | POA: Insufficient documentation

## 2015-08-27 DIAGNOSIS — M4854XA Collapsed vertebra, not elsewhere classified, thoracic region, initial encounter for fracture: Secondary | ICD-10-CM | POA: Insufficient documentation

## 2015-08-27 DIAGNOSIS — F0390 Unspecified dementia without behavioral disturbance: Secondary | ICD-10-CM | POA: Insufficient documentation

## 2015-08-27 DIAGNOSIS — Z7901 Long term (current) use of anticoagulants: Secondary | ICD-10-CM | POA: Diagnosis not present

## 2015-08-27 DIAGNOSIS — M545 Low back pain: Secondary | ICD-10-CM | POA: Diagnosis present

## 2015-08-27 LAB — COMPREHENSIVE METABOLIC PANEL
ALBUMIN: 2.7 g/dL — AB (ref 3.5–5.0)
ALK PHOS: 308 U/L — AB (ref 38–126)
ALT: 17 U/L (ref 14–54)
ANION GAP: 8 (ref 5–15)
AST: 34 U/L (ref 15–41)
BUN: 43 mg/dL — ABNORMAL HIGH (ref 6–20)
CALCIUM: 8.5 mg/dL — AB (ref 8.9–10.3)
CHLORIDE: 112 mmol/L — AB (ref 101–111)
CO2: 24 mmol/L (ref 22–32)
Creatinine, Ser: 1.19 mg/dL — ABNORMAL HIGH (ref 0.44–1.00)
GFR calc non Af Amer: 39 mL/min — ABNORMAL LOW (ref >60)
GFR, EST AFRICAN AMERICAN: 46 mL/min — AB (ref >60)
GLUCOSE: 134 mg/dL — AB (ref 65–99)
Potassium: 4.5 mmol/L (ref 3.5–5.1)
SODIUM: 144 mmol/L (ref 135–145)
Total Bilirubin: 0.4 mg/dL (ref 0.3–1.2)
Total Protein: 7 g/dL (ref 6.5–8.1)

## 2015-08-27 LAB — URINALYSIS COMPLETE WITH MICROSCOPIC (ARMC ONLY)
BILIRUBIN URINE: NEGATIVE
GLUCOSE, UA: NEGATIVE mg/dL
HGB URINE DIPSTICK: NEGATIVE
KETONES UR: NEGATIVE mg/dL
LEUKOCYTES UA: NEGATIVE
NITRITE: NEGATIVE
Protein, ur: 100 mg/dL — AB
SPECIFIC GRAVITY, URINE: 1.02 (ref 1.005–1.030)
pH: 5 (ref 5.0–8.0)

## 2015-08-27 LAB — CBC
HCT: 30.5 % — ABNORMAL LOW (ref 35.0–47.0)
HEMOGLOBIN: 9.3 g/dL — AB (ref 12.0–16.0)
MCH: 26.3 pg (ref 26.0–34.0)
MCHC: 30.4 g/dL — AB (ref 32.0–36.0)
MCV: 86.6 fL (ref 80.0–100.0)
PLATELETS: 161 10*3/uL (ref 150–440)
RBC: 3.52 MIL/uL — AB (ref 3.80–5.20)
RDW: 17.3 % — ABNORMAL HIGH (ref 11.5–14.5)
WBC: 14.6 10*3/uL — ABNORMAL HIGH (ref 3.6–11.0)

## 2015-08-27 LAB — PROTIME-INR
INR: 2.46
Prothrombin Time: 26.4 seconds — ABNORMAL HIGH (ref 11.4–15.0)

## 2015-08-27 MED ORDER — FOSFOMYCIN TROMETHAMINE 3 G PO PACK
3.0000 g | PACK | Freq: Once | ORAL | Status: AC
  Administered 2015-08-27: 3 g via ORAL

## 2015-08-27 MED ORDER — FOSFOMYCIN TROMETHAMINE 3 G PO PACK
PACK | ORAL | Status: AC
  Administered 2015-08-27: 3 g via ORAL
  Filled 2015-08-27: qty 3

## 2015-08-27 MED ORDER — OXYCODONE HCL 5 MG/5ML PO SOLN: 5.0000 mg | Freq: Four times a day (QID) | ORAL | Status: AC | PRN

## 2015-08-27 MED ORDER — OXYCODONE-ACETAMINOPHEN 5-325 MG PO TABS
1.0000 | ORAL_TABLET | Freq: Once | ORAL | Status: AC
  Administered 2015-08-27: 1 via ORAL
  Filled 2015-08-27: qty 1

## 2015-08-27 NOTE — ED Notes (Signed)
Pt brought by daughter who thinks her back is hurting, pt has dementia and was diagnosed with blood clots a week ago, daughter states she thinks her back is hurting because she was pulling at her clothes, pt wanting to lie down and not get out of the car. Pt very restless

## 2015-08-27 NOTE — ED Provider Notes (Signed)
[facility] Emergency Department Provider Note  Time seen: 6:06 PM  I have reviewed the triage vital signs and the nursing notes.   HISTORY  Chief Complaint Altered Mental Status    HPI Bailey Lindsey is a 82 y.o. female with a past medical history of arthritis, glaucoma, hypertension, coronary artery disease, recently diagnosed with bilateral DVTs placed on Coumadin, who presents the emergency department lower back pain. According to the daughter the patient lives with her, has home health care as well as goes to a senior daily daycare. States the patient is nonambulatory is bed and wheelchair bound. Today the patient has been complaining of lower back pain, is restless due to the discomfort, and became agitated at one point per the daughter. Daughter denies any recent falls or trauma. Patient has a history of severe dementia and cannot give a history or describe the pain but asked where she hurts she is able to point to her lower back.     Past Medical History  Diagnosis Date  . RA (rheumatoid arthritis) (HCC)   . Glaucoma     Followed by [facility]  . Hypertension   . Thyroid disease   . CAD (coronary artery disease)     Patient Active Problem List   Diagnosis Date Noted  . DVT, bilateral lower limbs (HCC) 08/08/2015  . Other fatigue 03/28/2014  . Hip pain 03/28/2014  . Memory loss 03/28/2014  . Dementia 03/07/2014    Past Surgical History  Procedure Laterality Date  . Abdominal hysterectomy    . Coronary artery bypass graft  2005    triple bypass  . Right hip replacement    . Dislocated left shoulder  2008  . Eye surgery      Cataract surgery    Current Outpatient Rx  Name  Route  Sig  Dispense  Refill  . acetaminophen (TYLENOL) 500 MG tablet   Oral   Take 1,000 mg by mouth 2 (two) times daily.          Marland Kitchen alendronate (FOSAMAX) 70 MG tablet   Oral   Take 70 mg by mouth once a week. Pt takes on Thursday. Take with a full  glass of water on an empty stomach.         Marland Kitchen amLODipine (NORVASC) 10 MG tablet   Oral   Take 10 mg by mouth daily.         Marland Kitchen atropine 1 % ophthalmic solution   Right Eye   Place 1 drop into the right eye 2 (two) times daily.         . brimonidine-timolol (COMBIGAN) 0.2-0.5 % ophthalmic solution   Right Eye   Place 1 drop into the right eye 2 (two) times daily.          . Carboxymethylcellul-Glycerin (OPTIVE) 0.5-0.9 % SOLN   Ophthalmic   Apply 1-2 drops to eye 4 (four) times daily as needed (dryness/irritation).         . cholecalciferol (VITAMIN D) 1000 units tablet   Oral   Take 2,000 Units by mouth daily.         . feeding supplement, ENSURE ENLIVE, (ENSURE ENLIVE) LIQD   Oral   Take 237 mLs by mouth 3 (three) times daily with meals.   237 mL   2   . ferrous sulfate 325 (65 FE) MG tablet   Oral   Take 1 tablet (325 mg total) by mouth daily with breakfast.  30 tablet   0   . levothyroxine (SYNTHROID, LEVOTHROID) 75 MCG tablet   Oral   Take 75 mcg by mouth daily before breakfast.         . loratadine (CLARITIN) 10 MG tablet   Oral   Take 10 mg by mouth daily.         . memantine (NAMENDA XR) 28 MG CP24 24 hr capsule   Oral   Take 28 mg by mouth daily.         . metoprolol (LOPRESSOR) 50 MG tablet   Oral   Take 50 mg by mouth 2 (two) times daily.         . polyethylene glycol (MIRALAX / GLYCOLAX) packet   Oral   Take 17 g by mouth daily as needed for moderate constipation.         . pravastatin (PRAVACHOL) 40 MG tablet   Oral   Take 40 mg by mouth at bedtime.          . predniSONE (DELTASONE) 5 MG tablet   Oral   Take 5 mg by mouth daily.         . sennosides-docusate sodium (SENOKOT-S) 8.6-50 MG tablet   Oral   Take 2 tablets by mouth 2 (two) times daily.         . sodium chloride (OCEAN) 0.65 % SOLN nasal spray   Each Nare   Place 1 spray into both nostrils as needed for congestion.         . travoprost,  benzalkonium, (TRAVATAN) 0.004 % ophthalmic solution   Both Eyes   Place 1 drop into both eyes at bedtime.         . traZODone (DESYREL) 50 MG tablet   Oral   Take 12.5 mg by mouth at bedtime as needed for sleep.         Marland Kitchen warfarin (COUMADIN) 3 MG tablet   Oral   Take 1 tablet (3 mg total) by mouth daily at 6 PM.   30 tablet   0   . Zinc Oxide (DESITIN) 13 % CREA   Apply externally   Apply 1 application topically 2 (two) times daily as needed (for irritation).           Allergies Caffeine; Codeine; Fish oil; Librium; Penicillins; Shellfish allergy; Latex; and Minocycline  No family history on file.  Social History Social History  Substance Use Topics  . Smoking status: Never Smoker   . Smokeless tobacco: Never Used  . Alcohol Use: No    Review of Systems Constitutional: Negative for fever. Cardiovascular: Negative for chest pain. Respiratory: Negative for shortness of breath. Gastrointestinal: Negative for abdominal pain Musculoskeletal: Positive for lower back pain. Neurological: Negative for headache 10-point ROS otherwise negative, however somewhat limited due to the patient's history of dementia.  ____________________________________________   PHYSICAL EXAM:  Constitutional: Alert. Answers questions with yes no answers. Eyes: Normal exam ENT   Head: Normocephalic and atraumatic.   Mouth/Throat: Mucous membranes are moist. Cardiovascular: Normal rate, regular rhythm. No murmur Respiratory: Normal respiratory effort without tachypnea nor retractions. Breath sounds are clear Gastrointestinal: Soft and nontender. No distention.   Musculoskeletal: Nontender with normal range of motion in all extremities Neurologic:  Normal speech and language. No gross focal neurologic deficits Skin:  Skin is warm, dry and intact.  Psychiatric: Mood and affect are normal. Speech and behavior are normal.   ____________________________________________      RADIOLOGY  T12 and L4 compression fractures  ____________________________________________    INITIAL IMPRESSION / ASSESSMENT AND PLAN / ED COURSE  Pertinent labs & imaging results that were available during my care of the patient were reviewed by me and considered in my medical decision making (see chart for details).  Patient presents to the emergency department with low back pain, per daughter was becoming agitated due to the discomfort. We will check labs including INR, lumbar x-rays. Daughter denies any recent falls or trauma. Patient is nonambulatory at baseline.  X-ray shows a new T12 compression fracture which is likely the source of the patient's pain. There is also an L4 compression fracture. Patient has been given pain medication in the ER. Daughter states she'll be able to manage the patient home. We will prescribe a liquid oxycodone for the patient. Patient's urinalysis is also showing many bacteria but no white or red blood cells. Given the possibility of urinary tract infection we will dose fosfomycin 3 g once in the emergency department. Patient will be discharged home liquid oxycodone and follow up with orthopedics if she continues to have discomfort.  ____________________________________________   FINAL CLINICAL IMPRESSION(S) / ED DIAGNOSES  Low back pain T12 and L4 compression fractures  Minna Antis, MD 08/27/15 2033

## 2015-08-27 NOTE — Discharge Instructions (Signed)
You have been seen in the emergency department for back pain. Your x-rays are consistent with compression spinal fractures. Please take your pain medication as needed, as prescribed. Please follow-up with orthopedics by calling the number provided if symptoms worsen or fail to improve. Return to the emergency department for any worsening pain, or any other symptom personally concerning to yourself.

## 2015-08-27 NOTE — ED Notes (Signed)
Pt came to ED c/o back pain starting at 1400. Per daughter, back pain is the right lower back. Pts daughter sts pt is more agitated and fidgety than normal.

## 2015-10-04 DEATH — deceased

## 2016-05-16 IMAGING — CR DG HIP W/ PELVIS BILAT
5 series · 5 of 5 positions shown · non-contrast
Comparison: None.

CLINICAL DATA: 89-year-old female status post fall 1 week ago,
difficulty walking. Initial encounter.

EXAM:
BILATERAL HIP WITH PELVIS - 4+ VIEW

[view not recorded (1 of 5)]
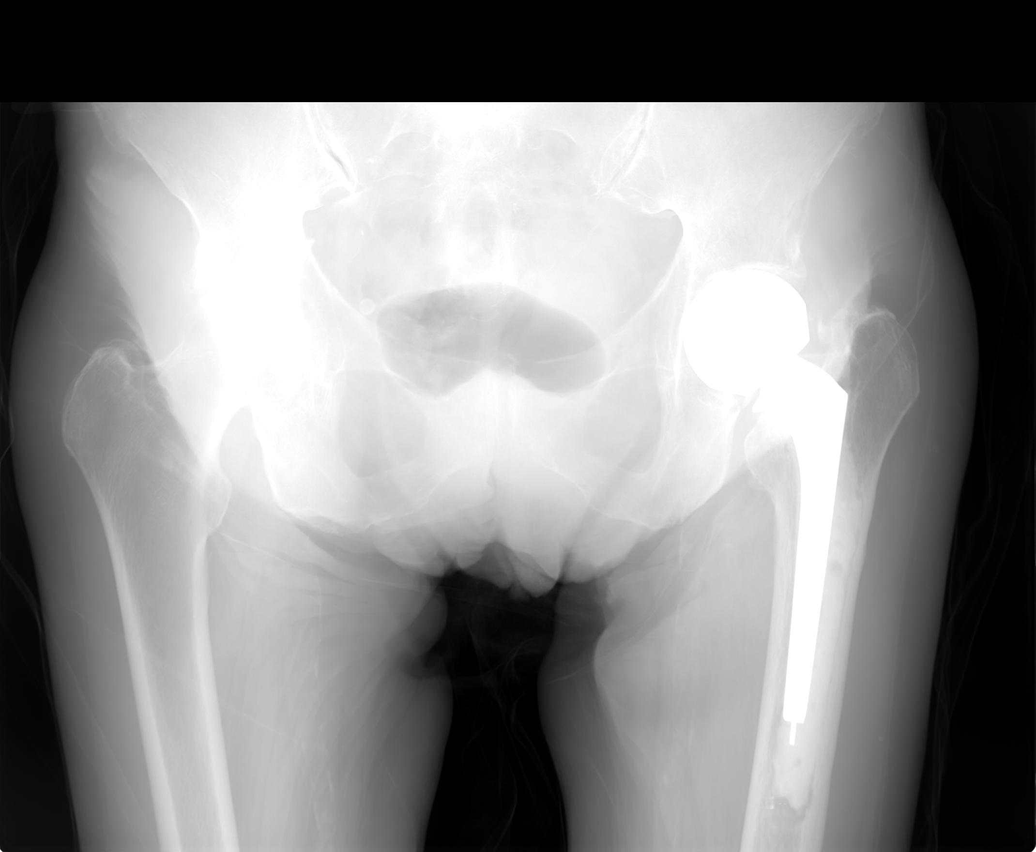

[view not recorded (2 of 5)]
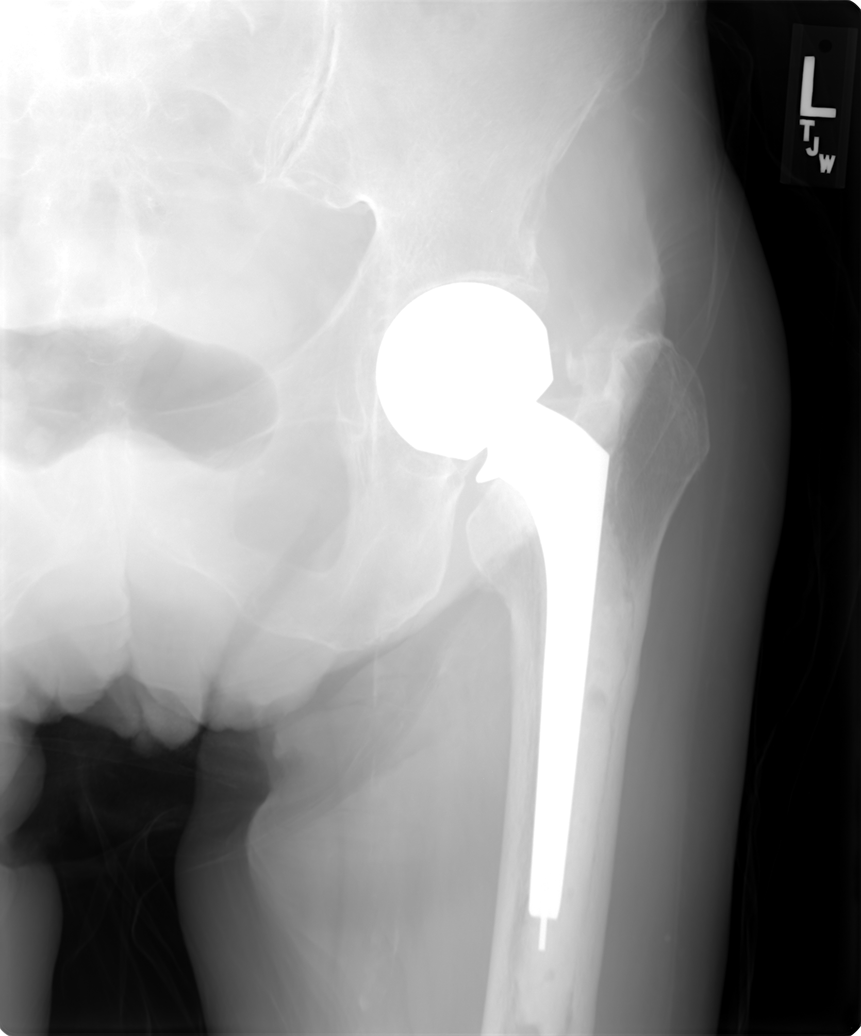

[view not recorded (3 of 5)]
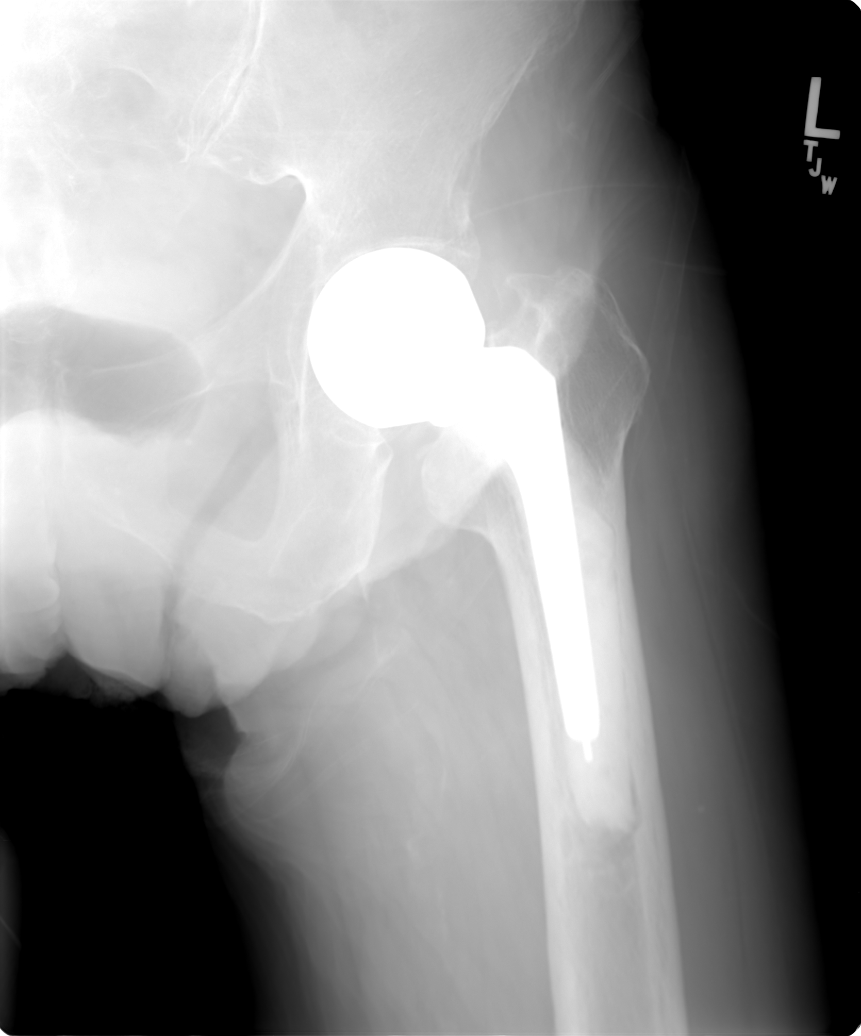

[view not recorded (4 of 5)]
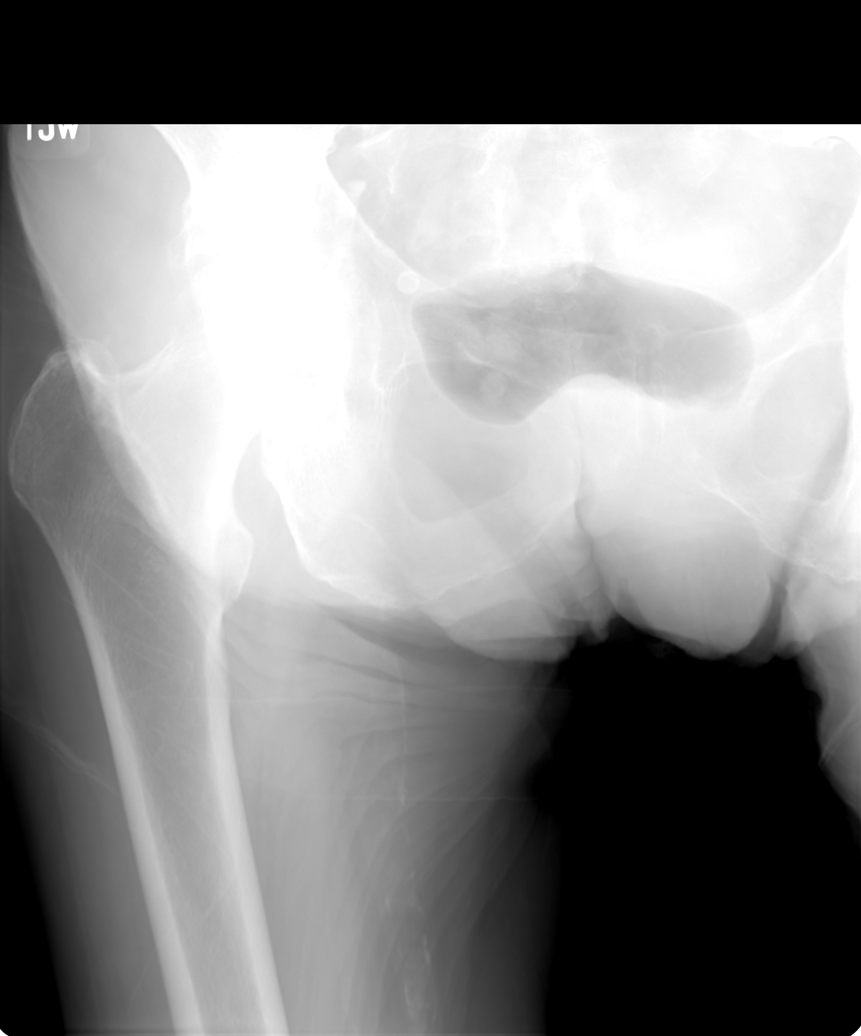

[view not recorded (5 of 5)]
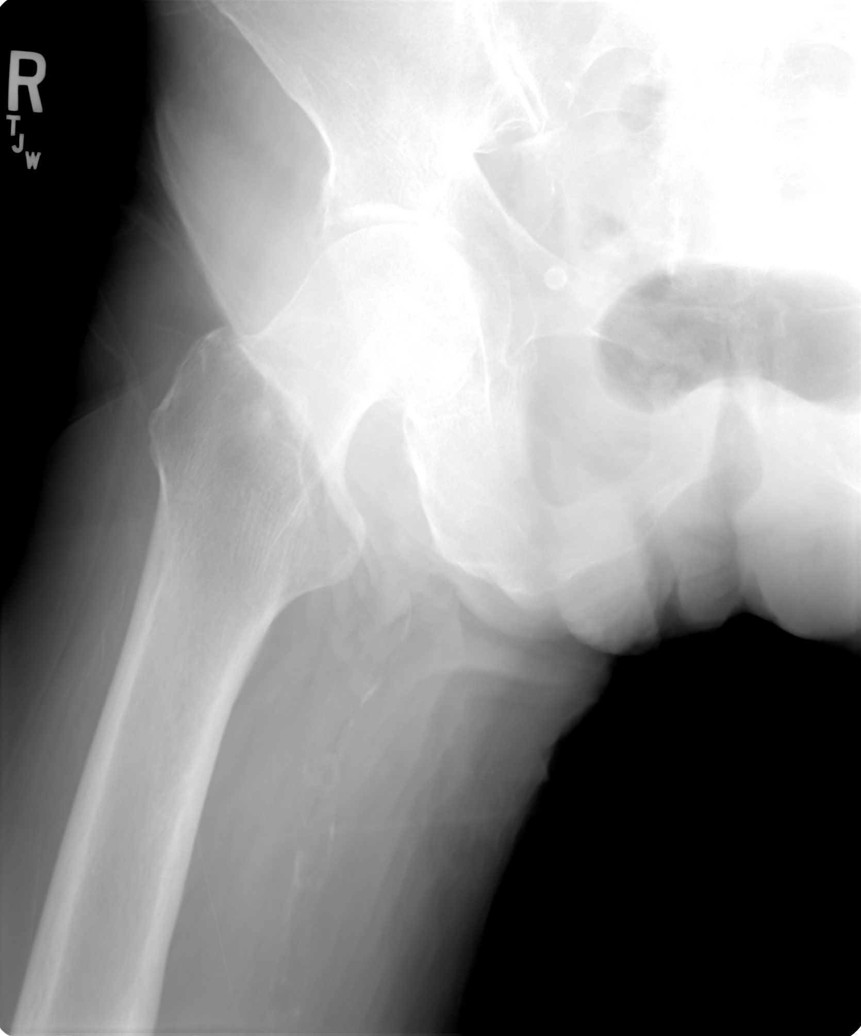

[5 of 5 positions shown; findings below may reference images not displayed]

FINDINGS: Sequelae of left proximal femur arthroplasty. Both femoral heads
appear normally located. Osteopenia. Pelvis intact. sacral ala
appear intact.

Proximal right femur intact.

Proximal left femur and femoral hardware appear intact. No acute
fracture identified.

Iliac artery and lower extremity calcified atherosclerosis. Pelvic
phleboliths.
IMPRESSION: No acute fracture or dislocation identified about the bilateral hips
or pelvis. If occult hip fracture is suspected or if the patient is
unable to weightbear, MRI is the preferred modality for further
evaluation.
